# Patient Record
Sex: Female | Born: 1980 | Race: White | Hispanic: No | Marital: Married | Smoking: Never smoker
Health system: Southern US, Community
[De-identification: ages and names within clinical notes are randomized; demographics above are authoritative.]

## PROBLEM LIST (undated history)

## (undated) DIAGNOSIS — J159 Unspecified bacterial pneumonia: Secondary | ICD-10-CM

## (undated) DIAGNOSIS — R339 Retention of urine, unspecified: Secondary | ICD-10-CM

## (undated) HISTORY — PX: TONSILLECTOMY AND ADENOIDECTOMY: SUR1326

## (undated) HISTORY — DX: Unspecified bacterial pneumonia: J15.9

## (undated) HISTORY — DX: Retention of urine, unspecified: R33.9

## (undated) HISTORY — PX: WISDOM TOOTH EXTRACTION: SHX21

---
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
## 2009-12-08 ENCOUNTER — Inpatient Hospital Stay: Payer: Self-pay

## 2013-02-05 ENCOUNTER — Inpatient Hospital Stay: Payer: Self-pay | Admitting: Obstetrics and Gynecology

## 2013-02-05 LAB — CBC WITH DIFFERENTIAL/PLATELET
Basophil #: 0.1 10*3/uL (ref 0.0–0.1)
Basophil %: 0.4 %
HCT: 35 % (ref 35.0–47.0)
Lymphocyte #: 2.8 10*3/uL (ref 1.0–3.6)
Lymphocyte %: 19.4 %
MCV: 90 fL (ref 80–100)
Monocyte %: 5.6 %
Neutrophil %: 73.6 %
RDW: 13.6 % (ref 11.5–14.5)
WBC: 14.4 10*3/uL — ABNORMAL HIGH (ref 3.6–11.0)

## 2013-02-06 LAB — URINALYSIS, COMPLETE
Bacteria: NONE SEEN
Glucose,UR: 50 mg/dL (ref 0–75)
Nitrite: NEGATIVE
Protein: NEGATIVE
Specific Gravity: 1.008 (ref 1.003–1.030)
WBC UR: 8 /HPF (ref 0–5)

## 2014-09-05 NOTE — Consult Note (Signed)
Pt voiding.  327ml residual this am with 700ml volume prior.for discharge with Flomax for 10 daysoffice 10 days for bladder scan.  Electronic Signatures: Smith Robertope, Ejay Lashley S (MD)  (Signed on 26-Sep-14 08:16)  Authored  Last Updated: 26-Sep-14 08:16 by Smith Robertope, Zebastian Carico S (MD)

## 2014-09-05 NOTE — Consult Note (Signed)
PATIENT NAME:  Cheryl Cheryl [name], Cheryl Cheryl [name] MR#:  [id number] DATE OF BIRTH:  1981-02-02  DATE OF CONSULTATION:  02/07/2013  REFERRING PHYSICIAN:   CONSULTING PHYSICIAN:  Madolyn FriezeBrian S. Achilles Dunkope, MD  HISTORY: Cheryl Cheryl [name] is Cheryl [name] 34 year old white female who underwent recent induction and delivery. Cheryl [name] Foley catheter was placed after placing her epidural. She had severe pain and discomfort. It was removed after Cheryl [name] short period of time. Based on the description, it appears the balloon was likely inflated within the urethra. She has been unable to void since delivery. She has required catheterization with the largest bladder volume being approximately 1100 to 1200 mL. This suggests bladder overdistention which will affect subsequent bladder emptying. She has had no other significant difficulty with urination prior to this occurrence. She has had catheters in the past which did not cause similar discomfort as the initially placed catheter. She has had subsequent intermittent catheterization, also without significant difficulty or discomfort. Her current bladder volume is approximately 400 mL. She does have knowledge of urine in the bladder. She is having no significant pain or discomfort. Options for treatment were discussed at length with Cheryl Cheryl [name] and her husband.   PAST MEDICAL HISTORY: Otherwise noncontributory.   PAST SURGICAL HISTORY: Also noncontributory.   ALLERGIES: No known drug allergies.   MEDICATIONS ON ADMISSION: Prenatal vitamins, iron 1 tablet daily, acetaminophen 325 mg 1 to 2 tablets every 3 hours as needed for pain.   PHYSICAL EXAMINATION:  VITAL SIGNS: Afebrile, vital signs stable.  HEENT: Within normal limits.  CHEST: Clear to auscultation bilaterally.  CARDIOVASCULAR: Regular rate and rhythm.  ABDOMEN: Moderately protuberant with mild generalized tenderness consistent with postpartum state. No appreciable CVA tenderness.  EXTREMITIES: Free range of motion x 4.  NEUROLOGIC: Motor and sensory grossly intact.    ASSESSMENT: Urinary retention, possible urethral trauma, postpartum status.   RECOMMENDATIONS: The option of catheterization and the use of Flomax with voiding trial early next week was discussed. The option of trying Flomax now with intermittent catheterization for larger bladder volumes in an attempt for spontaneous return of voiding prior to discharge was also discussed. She has elected to proceed with the most conservative approach. Cheryl [name] prescription for Flomax was written. We will give an additional dose early in the morning. Every 1 to 2 hour bladder scans are recommended. The bladder volume can get up to approximately 800 mL. If she gets to this level without ability to urinate, intermittent catheterization is recommended. Continued monitoring with the bladder scan every few hours to assess bladder volume throughout the night is also recommended with intermittent catheterization as needed for larger bladder volumes. If there is no return of function by early morning, placing Cheryl [name] Foley catheter to gravity drainage with continuing the Flomax will be recommended. An outpatient voiding trial early next week would then be recommended. We will follow up tomorrow as to her status. If there are any further questions, please free to contact us.   ____________________________ Madolyn FriezeBrian S. Achilles Dunkope, MD bsc:gb D: 02/07/2013 19:38:34 ET T: 02/07/2013 21:46:19 ET JOB#: [id number]  cc: Madolyn FriezeBrian S. Achilles Dunkope, MD, <Dictator> Madolyn FriezeBRIAN S Charmain Diosdado MD ELECTRONICALLY SIGNED 02/08/2013 18:05

## 2014-09-05 NOTE — Consult Note (Signed)
Patient seen, chart reviewed, note dictated.  Assessment: Urinary retention, possible urethral trauma postpartum  Recommendation: It appears that her symptoms with the initial Foley catheter were likely related to the balloon being inflated within the urethra.  There is nothing to be done from this standpoint except time and healing.  This may be a factor in her difficulty with urination.  The large bladder volume will significantly reduce her chance for spontaneous voiding.  At her young age, there is still a chance that she may do reasonably well.  The option of a Foley catheter until early next week with the addition of Flomax was discussed.  The option of adding Flomax with an additional dose in the morning with intermittent catheterization for bladder volumes greater than 800 mL was also discussed.  She wishes to proceed along this course.  Orders have been entered for the Flomax tonight and in the morning with breakfast.  Catheterization orders have also been entered.  Bladder scan every hour or so depending on bladder volume is recommended to avoid recurrent overdistention.  If she is able to void with minimal residual by morning, it is okay for discharge without a Foley catheter.  She would likely need to remain on the Flomax for at least 1 additional week.  If she is unable to void by morning, replacing the Foley catheter and continuing the Flomax with follow-up early next week for a voiding trial will otherwise be recommended.  If there are any further questions, please feel free to contact us.  Electronic Signatures: Smith Robertope, Nydia Ytuarte S (MD)  (Signed on 25-Sep-14 17:34)  Authored  Last Updated: 25-Sep-14 17:34 by Smith Robertope, Leianna Barga S (MD)

## 2017-12-12 ENCOUNTER — Other Ambulatory Visit (HOSPITAL_COMMUNITY)
Admission: RE | Admit: 2017-12-12 | Discharge: 2017-12-12 | Disposition: A | Discharge status: Home / Self Care | Payer: 59 | Source: Ambulatory Visit | Attending: Certified Nurse Midwife | Admitting: Certified Nurse Midwife

## 2017-12-12 ENCOUNTER — Encounter: Payer: Self-pay | Admitting: Certified Nurse Midwife

## 2017-12-12 ENCOUNTER — Ambulatory Visit (INDEPENDENT_AMBULATORY_CARE_PROVIDER_SITE_OTHER): Payer: 59 | Admitting: Certified Nurse Midwife

## 2017-12-12 VITALS — BP 110/60 | HR 60 | Ht 67.0 in | Wt 171.0 lb

## 2017-12-12 DIAGNOSIS — Z30431 Encounter for routine checking of intrauterine contraceptive device: Secondary | ICD-10-CM | POA: Diagnosis not present

## 2017-12-12 DIAGNOSIS — Z01419 Encounter for gynecological examination (general) (routine) without abnormal findings: Secondary | ICD-10-CM | POA: Diagnosis not present

## 2017-12-12 DIAGNOSIS — N898 Other specified noninflammatory disorders of vagina: Secondary | ICD-10-CM | POA: Insufficient documentation

## 2017-12-12 DIAGNOSIS — Z01411 Encounter for gynecological examination (general) (routine) with abnormal findings: Secondary | ICD-10-CM | POA: Diagnosis not present

## 2017-12-12 DIAGNOSIS — Z975 Presence of (intrauterine) contraceptive device: Secondary | ICD-10-CM | POA: Diagnosis not present

## 2017-12-12 DIAGNOSIS — Z124 Encounter for screening for malignant neoplasm of cervix: Secondary | ICD-10-CM

## 2017-12-12 LAB — POCT WET PREP (WET MOUNT): TRICHOMONAS WET PREP HPF POC: ABSENT

## 2017-12-12 NOTE — Progress Notes (Signed)
Gynecology Annual Exam  PCP: Patient, No Pcp Per  Chief Complaint:  Chief Complaint  Patient presents with  . Gynecologic Exam    due to have mirena removed, ? reinserted; still gets bladder spasms infrequently    History of Present Illness:Cheryl [name] is a 37 year old Caucasian/White female, G2 P2002, who presents for her exam. She is no significant gyn problems. Occasionally will have bladder spasms if she waits too long to urinate (usually overnight) and has urinary hesitancy. Has not had to see Dr Achilles Dunk since 2016. Has had more vaginal discharge, sometimes with an odor.   She has had rare spotting on the IUD  The patient's past medical history is remarkable for a PPH after the birth of her first child and urinary retention after the birth of her second child due to urethral trauma from foley. Since her last annual exam 12/23/2014 , she has had no other significant changes in her health history.  She is sexually active. She is currently using an IUD for contraception. Mirena IUD was placed on 03/22/2013. She is thinking of having the Mirena replaced.   Her most recent pap smear was obtained 12/23/2014 and was with negative cells and negative HPV DNA.  Mammogram is not applicable.  There is no family history of breast cancer.  There is no family history of ovarian cancer.  The patient does not do monthly self breast exams.  The patient does not smoke.  The patient does not drink alcohol. The patient does not use illegal drugs.  The patient exercises by walking 2-3x/week x 30 min. Works in [employer].  The patient does get adequate calcium in her diet.  She had a recent cholesterol screen in 2018 at work  that she reports was  normal.     Review of Systems: Review of Systems  Constitutional: Negative for chills, fever and weight loss.  HENT: Negative for congestion, sinus pain and sore throat.   Eyes: Negative for blurred vision and pain.  Respiratory: Negative for hemoptysis,  shortness of breath and wheezing.   Cardiovascular: Negative for chest pain, palpitations and leg swelling.  Gastrointestinal: Negative for abdominal pain, blood in stool, diarrhea, heartburn, nausea and vomiting.  Genitourinary: Negative for dysuria, frequency, hematuria and urgency.       Amenorrhea on IUD; occasional bladder spasm  Musculoskeletal: Negative for back pain, joint pain and myalgias.  Skin: Negative for itching and rash.  Neurological: Negative for dizziness, tingling and headaches.  Endo/Heme/Allergies: Negative for environmental allergies and polydipsia. Does not bruise/bleed easily.       Negative for hirsutism   Psychiatric/Behavioral: Negative for depression. The patient is not nervous/anxious and does not have insomnia.     Past Medical History:  Past Medical History:  Diagnosis Date  . Bacterial pneumonia    age 2  . Postpartum hemorrhage    G1  . Urinary retention    after second delivery    Past Surgical History:  Past Surgical History:  Procedure Laterality Date  . MANDIBLE SURGERY  1999  . TONSILLECTOMY AND ADENOIDECTOMY     middle school age  . WISDOM TOOTH EXTRACTION     middle school/high school - all four    Family History:  Family History  Problem Relation Age of Onset  . Diabetes Mother        type 2 and GDM  . Hypertension Mother   . Deep vein thrombosis Mother   . Hypertension Father   .  Diabetes Maternal Grandmother   . Heart disease Maternal Grandmother     Social History:  Social History   Socioeconomic History  . Marital status: Married    Spouse name: Not on file  . Number of children: 2  . Years of education: 7619  . Highest education level: Not on file  Occupational History  . Occupation: PHYSICAL THERAPIST    Comment: [employer]  Social Needs  . Financial resource strain: Not on file  . Food insecurity:    Worry: Not on file    Inability: Not on file  . Transportation needs:    Medical: Not on file     Non-medical: Not on file  Tobacco Use  . Smoking status: Never Smoker  . Smokeless tobacco: Never Used  Substance and Sexual Activity  . Alcohol use: Yes    Comment: OCC  . Drug use: Never  . Sexual activity: Yes    Birth control/protection: IUD  Lifestyle  . Physical activity:    Days per week: Not on file    Minutes per session: Not on file  . Stress: Not on file  Relationships  . Social connections:    Talks on phone: Not on file    Gets together: Not on file    Attends religious service: Not on file    Active member of club or organization: Not on file    Attends meetings of clubs or organizations: Not on file    Relationship status: Not on file  . Intimate partner violence:    Fear of current or ex partner: Not on file    Emotionally abused: Not on file    Physically abused: Not on file    Forced sexual activity: Not on file  Other Topics Concern  . Not on file  Social History Narrative  . Not on file    Allergies:  No Known Allergies  Medications: Physical Exam Vitals: BP 110/60   Pulse 60   Ht 5\' 7"  (1.702 m)   Wt 171 lb (77.6 kg)   LMP  (LMP Unknown) Comment: has mirena  BMI 26.78 kg/m   General: pleasant WF in NAD HEENT: normocephalic, anicteric Neck: no thyroid enlargement, no palpable nodules, no cervical lymphadenopathy  Pulmonary: No increased work of breathing, CTAB Cardiovascular: RRR, without murmur  Breast: Breast symmetrical, no tenderness, no palpable nodules or masses, no skin or nipple retraction present, no nipple discharge.  No axillary, infraclavicular or supraclavicular lymphadenopathy. Abdomen: Soft, non-tender, non-distended.  Umbilicus without lesions.  No hepatomegaly or masses palpable. No evidence of hernia. Genitourinary:  External: Normal external female genitalia.  Normal urethral meatus, normal  Bartholin's and Skene's glands.    Vagina: Normal vaginal mucosa, no evidence of prolapse.    Cervix: Grossly normal in appearance,  no bleeding, non-tender, IUD string palpable, yellowish mucoid discharge at cervix  Uterus: Anteverted, normal size, shape, and consistency, mobile, and non-tender  Adnexa: No adnexal masses, non-tender  Rectal: deferred  Lymphatic: no evidence of inguinal lymphadenopathy Extremities: no edema, erythema, or tenderness Neurologic: Grossly intact Psychiatric: mood appropriate, affect full  Wet prep-negative for hyphae, clue cells or Trich   Assessment: 37 y.o. G2 P2 for well woman exam Mirena IUD in place-will expire later this year   Plan:   1) Breast cancer screening - recommend monthly self breast exam.   2) Cervical cancer screening - Pap was done. ASCCP guidelines and rational discussed.  Patient opts for every 3 years screening interval  3)  Contraception -options discussed. Desires to have Mirena IUD replaced. To return in 4-5 months for replacement  4) Routine healthcare maintenance including cholesterol and diabetes screening UTD   Farrel Conners, CNM

## 2017-12-13 LAB — CYTOLOGY - PAP
Diagnosis: NEGATIVE
HPV (WINDOPATH): NOT DETECTED

## 2017-12-15 ENCOUNTER — Encounter: Payer: Self-pay | Admitting: Certified Nurse Midwife

## 2018-02-26 DIAGNOSIS — Z713 Dietary counseling and surveillance: Secondary | ICD-10-CM | POA: Diagnosis not present

## 2018-04-24 ENCOUNTER — Encounter: Payer: Self-pay | Admitting: Nurse Practitioner

## 2018-04-24 ENCOUNTER — Ambulatory Visit (INDEPENDENT_AMBULATORY_CARE_PROVIDER_SITE_OTHER): Payer: 59 | Admitting: Nurse Practitioner

## 2018-04-24 ENCOUNTER — Other Ambulatory Visit: Payer: Self-pay

## 2018-04-24 VITALS — BP 106/68 | HR 77 | Temp 98.5°F | Ht 67.0 in | Wt 171.6 lb

## 2018-04-24 DIAGNOSIS — Z008 Encounter for other general examination: Secondary | ICD-10-CM

## 2018-04-24 DIAGNOSIS — Z7689 Persons encountering health services in other specified circumstances: Secondary | ICD-10-CM | POA: Diagnosis not present

## 2018-04-24 DIAGNOSIS — Z0189 Encounter for other specified special examinations: Secondary | ICD-10-CM | POA: Diagnosis not present

## 2018-04-24 NOTE — Progress Notes (Signed)
Subjective:    Patient ID: Cheryl [name], female    DOB: 12/17/1980, 38 y.o.   MRN: [id number]  Cheryl [name] is a 37 y.o. female presenting on 04/24/2018 for Establish Care (employee screening )   HPI Establish Care New Provider Pt last seen by PCP many years ago - pediatrics and has regular GYN care for prevention.  Obtain records from [facility].    Biometric Screening  Goal to lose 20 lbs to 148 lbs for getting back to pre-pregnancy weight.  Requires new biometric annually for insurance cost benefit.  Has made recent change to reduce carbohydrates in her diet to work toward weight loss.  - Participates in aerobics class ("pound" class) once monthly - in past was once weekly. - Active moderately with work but no other regular activity outside of work.  Past Medical History:  Diagnosis Date  . Bacterial pneumonia    age 17  . Postpartum hemorrhage    G1  . Urinary retention    after second delivery   Past Surgical History:  Procedure Laterality Date  . MANDIBLE SURGERY  1999  . TONSILLECTOMY AND ADENOIDECTOMY     middle school age  . WISDOM TOOTH EXTRACTION     middle school/high school - all four   Social History   Socioeconomic History  . Marital status: Married    Spouse name: Not on file  . Number of children: 2  . Years of education: 70  . Highest education level: Not on file  Occupational History  . Occupation: PHYSICAL THERAPIST    Comment: [employer]  Social Needs  . Financial resource strain: Not on file  . Food insecurity:    Worry: Not on file    Inability: Not on file  . Transportation needs:    Medical: Not on file    Non-medical: Not on file  Tobacco Use  . Smoking status: Never Smoker  . Smokeless tobacco: Never Used  Substance and Sexual Activity  . Alcohol use: Yes    Comment: OCC  . Drug use: Never  . Sexual activity: Yes    Birth control/protection: IUD  Lifestyle  . Physical activity:    Days per week: Not on file    Minutes  per session: Not on file  . Stress: Not on file  Relationships  . Social connections:    Talks on phone: Not on file    Gets together: Not on file    Attends religious service: Not on file    Active member of club or organization: Not on file    Attends meetings of clubs or organizations: Not on file    Relationship status: Not on file  . Intimate partner violence:    Fear of current or ex partner: Not on file    Emotionally abused: Not on file    Physically abused: Not on file    Forced sexual activity: Not on file  Other Topics Concern  . Not on file  Social History Narrative  . Not on file   Family History  Problem Relation Age of Onset  . Diabetes Mother        type 2 and GDM  . Hypertension Mother   . Deep vein thrombosis Mother   . Hypertension Father   . Diabetes Maternal Grandmother   . Heart disease Maternal Grandmother    Current Outpatient Medications on File Prior to Visit  Medication Sig  . ibuprofen (ADVIL,MOTRIN) 200 MG tablet Take 200  mg by mouth as needed.  Marland Kitchen levonorgestrel (MIRENA) 20 MCG/24HR IUD 1 each by Intrauterine route once.   No current facility-administered medications on file prior to visit.     Review of Systems  Constitutional: Negative for chills and fever.  HENT: Negative for congestion and sore throat.   Eyes: Negative for pain.  Respiratory: Negative for cough, shortness of breath and wheezing.   Cardiovascular: Negative for chest pain, palpitations and leg swelling.  Gastrointestinal: Negative for abdominal pain, blood in stool, constipation, diarrhea, nausea and vomiting.  Endocrine: Negative for polydipsia.  Genitourinary: Negative for dysuria, frequency, hematuria and urgency.  Musculoskeletal: Negative for back pain, myalgias and neck pain.  Skin: Negative.  Negative for rash.  Allergic/Immunologic: Negative for environmental allergies.  Neurological: Negative for dizziness, weakness and headaches.  Hematological: Does not  bruise/bleed easily.  Psychiatric/Behavioral: Negative for dysphoric mood and suicidal ideas. The patient is not nervous/anxious.    Per HPI unless specifically indicated above     Objective:    BP 106/68 (BP Location: Left Arm, Patient Position: Sitting, Cuff Size: Normal)   Pulse 77   Temp 98.5 F (36.9 C) (Oral)   Ht 5\' 7"  (1.702 m)   Wt 171 lb 9.6 oz (77.8 kg)   BMI 26.88 kg/m   Wt Readings from Last 3 Encounters:  04/24/18 171 lb 9.6 oz (77.8 kg)  12/12/17 171 lb (77.6 kg)    Physical Exam  Constitutional: She is oriented to person, place, and time. She appears well-developed and well-nourished. No distress.  HENT:  Head: Normocephalic and atraumatic.  Right Ear: External ear normal.  Left Ear: External ear normal.  Nose: Nose normal.  Mouth/Throat: Oropharynx is clear and moist.  Eyes: Pupils are equal, round, and reactive to light. Conjunctivae are normal.  Neck: Normal range of motion. Neck supple. No JVD present. No tracheal deviation present. No thyromegaly present.  Cardiovascular: Normal rate, regular rhythm, normal heart sounds and intact distal pulses. Exam reveals no gallop and no friction rub.  No murmur heard. Pulmonary/Chest: Effort normal and breath sounds normal. No respiratory distress.  Musculoskeletal: Normal range of motion.  Lymphadenopathy:    She has no cervical adenopathy.  Neurological: She is alert and oriented to person, place, and time. No cranial nerve deficit.  Skin: Skin is warm and dry. Capillary refill takes less than 2 seconds.  Multiple nevi, several on back that have irregular borders, one with more than one color  Psychiatric: She has a normal mood and affect. Her behavior is normal. Judgment and thought content normal.  Nursing note and vitals reviewed.   Results for orders placed or performed in visit on 12/12/17  POCT Wet Prep [facility])  Result Value Ref Range   Source Wet Prep POC vagina    WBC, Wet Prep HPF POC      Bacteria Wet Prep HPF POC  Few   BACTERIA WET PREP MORPHOLOGY POC     Clue Cells Wet Prep HPF POC None None   Clue Cells Wet Prep Whiff POC     Yeast Wet Prep HPF POC None None   KOH Wet Prep POC  None   Trichomonas Wet Prep HPF POC Absent Absent  Cytology - PAP  Result Value Ref Range   Adequacy      Satisfactory for evaluation  endocervical/transformation zone component PRESENT.   Diagnosis      NEGATIVE FOR INTRAEPITHELIAL LESIONS OR MALIGNANCY.   HPV NOT DETECTED    Material Submitted  CervicoVaginal Pap [ThinPrep Imaged]    CYTOLOGY - PAP PAP RESULT       Assessment & Plan:   Problem List Items Addressed This Visit    None    Visit Diagnoses    Encounter for biometric screening    -  Primary Patient due for biometric screen. - Advised to have about 15 lbs weight loss for normal BMI - Increase physical activity - Labs fasting in next 7 days. - FOLLOW-UP for annual physical October 2020.   Relevant Orders   Lipid panel   BASIC METABOLIC PANEL WITH GFR   Encounter to establish care     [facility]  [facility] be [facility].  Past medical, family, and surgical history reviewed w/ patient in clinic.       Follow up plan: Return in about 10 months (around 02/23/2019) for annual physical.  Wilhelmina McardleLauren Bomani Oommen, DNP, AGPCNP-BC Adult Gerontology Primary Care Nurse Practitioner [facility] 04/24/2018, 2:27 PM

## 2018-04-24 NOTE — Patient Instructions (Addendum)
Ronn MelenaEmily A Armistead,   Thank you for coming in to clinic today.  1. You will be due for FASTING BLOOD WORK.  This means you should eat no food or drink after midnight.  Drink only water or coffee without cream/sugar on the morning of your lab visit. - Please go ahead and schedule a "Lab Only" visit in the morning at the clinic for lab draw in the next 7 days. - Your results will be available about 2-3 days after blood draw.  If you have set up a MyChart account, you can can log in to MyChart online to view your results and a brief explanation. Also, we can discuss your results together at your next office visit if you would like.   Please schedule a follow-up appointment with Wilhelmina McardleLauren Harlen Danford, AGNP.  Return in about 10 months (around 02/23/2019) for annual physical.  If you have any other questions or concerns, please feel free to call the clinic or send a message through MyChart. You may also schedule an earlier appointment if necessary.  You will receive a survey after today's visit either digitally by e-mail or paper by [employer] mail. Your experiences and feedback matter to us.  Please respond so we know how we are doing as we provide care for you.   Wilhelmina McardleLauren Totiana Everson, DNP, AGNP-BC Adult Gerontology Nurse Practitioner [facility], [facility]

## 2018-04-26 DIAGNOSIS — Z0189 Encounter for other specified special examinations: Secondary | ICD-10-CM | POA: Diagnosis not present

## 2018-04-27 LAB — BASIC METABOLIC PANEL WITH GFR
BUN: 17 mg/dL (ref 7–25)
CO2: 25 mmol/L (ref 20–32)
Calcium: 9.2 mg/dL (ref 8.6–10.2)
Chloride: 104 mmol/L (ref 98–110)
Creat: 0.93 mg/dL (ref 0.50–1.10)
GFR, Est African American: 92 mL/min/{1.73_m2} (ref >=60)
GFR, Est Non African American: 79 mL/min/{1.73_m2} (ref >=60)
Glucose, Bld: 94 mg/dL (ref 65–99)
Potassium: 4.2 mmol/L (ref 3.5–5.3)
Sodium: 139 mmol/L (ref 135–146)

## 2018-04-27 LAB — LIPID PANEL
Cholesterol: 185 mg/dL (ref <200)
HDL: 64 mg/dL (ref >50)
LDL Cholesterol (Calc): 107 mg/dL (calc) — ABNORMAL HIGH
Non-HDL Cholesterol (Calc): 121 mg/dL (calc) (ref <130)
Total CHOL/HDL Ratio: 2.9 (calc) (ref <5.0)
Triglycerides: 57 mg/dL (ref <150)

## 2018-05-28 DIAGNOSIS — E669 Obesity, unspecified: Secondary | ICD-10-CM | POA: Diagnosis not present

## 2018-05-28 DIAGNOSIS — Z713 Dietary counseling and surveillance: Secondary | ICD-10-CM | POA: Diagnosis not present

## 2018-06-07 ENCOUNTER — Telehealth: Payer: Self-pay | Admitting: Certified Nurse Midwife

## 2018-06-07 NOTE — Telephone Encounter (Signed)
Patient is schedule 06/15/18 with CLG for Mirena removal and reinsertion at 8:50

## 2018-06-15 ENCOUNTER — Encounter: Payer: Self-pay | Admitting: Certified Nurse Midwife

## 2018-06-15 ENCOUNTER — Ambulatory Visit (INDEPENDENT_AMBULATORY_CARE_PROVIDER_SITE_OTHER): Payer: 59 | Admitting: Certified Nurse Midwife

## 2018-06-15 VITALS — BP 118/78 | Ht 67.0 in | Wt 170.0 lb

## 2018-06-15 DIAGNOSIS — Z30433 Encounter for removal and reinsertion of intrauterine contraceptive device: Secondary | ICD-10-CM

## 2018-06-15 NOTE — Progress Notes (Signed)
    GYNECOLOGY OFFICE PROCEDURE NOTE  Cheryl [name] is a 38 y.o. [phone] here for Mirena IUD replacement. Her current IUD was inserted 03/22/2013 and is expiring. Has had no concerns with her current IUD and would like to have a new Mirena inserted.  Last pap smear was on 12/12/2017 and was normal.   Patient identified, informed consent performed, consent signed.   Discussed risks of irregular bleeding, cramping, infection, expulsion, and malpositioning of the IUD. Time out was performed.   On bimanual exam, uterus was Anteverted. Speculum placed in the vagina. The IUD strings were not readily seen at the cervix, but were just inside the cervix. The strings were grasped blindly with a packing forceps and  The IUD was removed easily and intact. The cervix was then prepped with Betadine x 2. Cervix was sprayed with Hurricaine anesthetic and  grasped anteriorly with a single tooth tenaculum.  Uterus sounded to 6 cm.  Mirena  IUD placed per manufacturer's recommendations.  Strings trimmed to 3 cm. Tenaculum was removed, and silver nitrate was applied to tenaculum sites for hemostasis.  Patient tolerated procedure well.   Patient was given post-procedure instructions.  Patient was also asked to check IUD strings periodically and follow up in 4 weeks for IUD check.  Farrel Conners, CNM 06/15/18

## 2018-07-13 ENCOUNTER — Encounter: Payer: Self-pay | Admitting: Certified Nurse Midwife

## 2018-07-13 ENCOUNTER — Ambulatory Visit (INDEPENDENT_AMBULATORY_CARE_PROVIDER_SITE_OTHER): Payer: 59 | Admitting: Certified Nurse Midwife

## 2018-07-13 VITALS — BP 100/52 | HR 67 | Ht 67.0 in | Wt 170.0 lb

## 2018-07-13 DIAGNOSIS — N343 Urethral syndrome, unspecified: Secondary | ICD-10-CM | POA: Diagnosis not present

## 2018-07-13 DIAGNOSIS — Z30431 Encounter for routine checking of intrauterine contraceptive device: Secondary | ICD-10-CM

## 2018-07-13 DIAGNOSIS — M6289 Other specified disorders of muscle: Secondary | ICD-10-CM

## 2018-07-13 NOTE — Progress Notes (Signed)
  History of Present Illness:  Cheryl [name] is a 38 y.o. that had a Mirena IUD placed approximately 1 month ago. Since that time, she states that she has had no bleeding or pain She does complain of "butt spasms" . These occur randomly and used to come once in 2-3 months, but recently they have become more frequent. The "spasm" or pain usually lasts 10-15 minutes and she can no sit or move while that is going on. The pain does not occur when she is having a BM. No blood in her stool. She also reports occasionally having the urge to urinate, but not being able to empty her bladder until her muscles relax. Has a history of 2 vaginal deliveries and has had some left lower back pains since then. . No other trauma to her pelvic floor. No dyspareunia. Has started at a boot camp in January and is lifting more weights/ exercising more in the last 2 months. PMHx: She  has a past medical history of Bacterial pneumonia, Postpartum hemorrhage, and Urinary retention. Also,  has a past surgical history that includes Mandible surgery (1999); Tonsillectomy and adenoidectomy; and Wisdom tooth extraction., family history includes Alzheimer's disease in her paternal grandfather; Deep vein thrombosis in her mother; Diabetes in her maternal grandmother and mother; Healthy in her daughter and daughter; Heart disease in her maternal grandmother; Hypertension in her father and mother; Mental illness in her brother.,  reports that she has never smoked. She has never used smokeless tobacco. She reports current alcohol use. She reports that she does not use drugs.  She has a current medication list which includes the following prescription(s): ibuprofen and levonorgestrel. Also, has No Known Allergies.  ROS  Physical Exam:  Vital signs: BP (!) 100/52   Pulse 67   Ht 5\' 7"  (1.702 m)   Wt 170 lb (77.1 kg)   BMI 26.63 kg/m   Constitutional: Well nourished, well developed female in no acute distress.  Neuro: Grossly  intact Psych:  Normal mood and affect.    Pelvic exam: External/BUS: no lesion, no discharge Vagina: no lesions, no bleeding Cervix: Two IUD strings present seen coming from the cervical os.   Assessment: IUD strings present in proper location; pt doing well Possible pelvic floor muscle spasms  Plan: Aware that her IUD expires in 5 years. Consult PT for pelvic floor therapy/ evaluation  She was amenable to this plan and we will see her back in 1 year/PRN.  Farrel Conners, CNM  Farrel Conners, [place]

## 2018-10-15 ENCOUNTER — Ambulatory Visit: Payer: 59 | Attending: Certified Nurse Midwife

## 2018-10-15 ENCOUNTER — Other Ambulatory Visit: Payer: Self-pay

## 2018-10-15 DIAGNOSIS — M62838 Other muscle spasm: Secondary | ICD-10-CM | POA: Diagnosis present

## 2018-10-15 DIAGNOSIS — R293 Abnormal posture: Secondary | ICD-10-CM

## 2018-10-15 NOTE — Patient Instructions (Signed)
    Sit up with a tall spine and cross one leg over the other knee. Hinge from the hip and lean until you can feel a stretch through your bottom hold for 5 deep breaths and then switch sides. Repeat 2-3 times on each side.   Self-assess the pelvic floor muscles by inserting one finger into the vagina and trying to "squeeze" like you   Self External Trigger Point Relief    1) Wash your hands and prop yourself up in a way where you can easily reach the vagina. You may wish to have a small hand-held mirror near by.  2) Use the 2 middle fingers to put gentle pressure on the three external pelvic floor muscles and hold pressure and take deep breaths as you allow the tension to release and discomfort to dissipate   3) Repeat the process for any trigger points you find spending between 3-10 minutes on this every 1-2 days until you do not find any more trigger points or you are told otherwise by your therapist.  Self Internal Trigger Point Relief    1) Wash your hands and prop yourself up in a way where you can easily reach the vagina. You may wish to have a small hand-held mirror near by.  2) lubricate the tool and vaginal opening using a hypoallergenic lubricant such as "slippery-stuff".   3) Slowly and gently insert the tool into the vagina using deep breaths to allow relaxation of the muscles around the tool.  4) Avoiding the "12 o-clock" region near the urethra, gently use the handle of the tool like a lever to press the angled tip of the tool onto the wall of the pelvic floor.   5) Move the tool to different areas of the pelvic floor and feel for areas that are tender called "trigger points". When you find one hold the tool still, applying just enough pressure to elicit mild discomfort and take deep belly breaths until the discomfort subsides or decreases by at least 50%.   6) Repeat the process for any trigger points you find spending between 3-10 minutes on this per night until you do not  find any more trigger points or you are told otherwise by your therapist..   Female version: Dr. Kathline Magic Premium Prostate Massager      ([place]) Hold for 30 seconds (5 deep breaths) and repeat 2-3 times on each side once a day  Pelvic Rotation: Contract / Relax (Supine)   MET to Correct Right Anteriorly Rotated/Left Posteriorly Rotated Innominate   Begin laying on your back with your feet at 90 degrees. Put a dowel/broomstick  through your legs, behind your right knee and in front of your left knee. Stabilize the dowel on ether side with your hands.  Press down with the right leg and up with the left leg. Hold for 5 seconds  then slowly relax. Repeat 5 times.

## 2018-10-15 NOTE — Therapy (Signed)
[facility] MAIN [facility] SERVICES [address] [place], [place], [address] Phone: [phone]   Fax:  [phone]  Physical Therapy Evaluation  The patient has been informed of current processes in place at Outpatient Rehab to protect patients from Covid-19 exposure including social distancing, schedule modifications, and new cleaning procedures. After discussing their particular risk with a therapist based on the patient's personal risk factors, the patient has decided to proceed with in-person therapy.   Patient Details  Name: Cheryl [name] MRN: [id number] Date of Birth: Aug 03, 1980 Referring Provider (PT): Farrel Conners   Encounter Date: 10/15/2018  PT End of Session - 10/15/18 0923    Visit Number  1    Number of Visits  10    Date for PT Re-Evaluation  12/24/18    Authorization - Visit Number  1    Authorization - Number of Visits  10    PT Start Time  0740    PT Stop Time  0840    PT Time Calculation (min)  60 min    Activity Tolerance  Patient tolerated treatment well;No increased pain       Past Medical History:  Diagnosis Date  . Bacterial pneumonia    age 38  . Postpartum hemorrhage    G1  . Urinary retention    after second delivery    Past Surgical History:  Procedure Laterality Date  . MANDIBLE SURGERY  1999  . TONSILLECTOMY AND ADENOIDECTOMY     middle school age  . WISDOM TOOTH EXTRACTION     middle school/high school - all four    There were no vitals filed for this visit.     Pelvic Floor Physical Therapy Evaluation and Assessment  SCREENING  Falls in last 6 mo: no    Patient's communication preference:   Red Flags:  Have you had any night sweats? no Unexplained weight loss? no Saddle anesthesia? no Unexplained changes in bowel or bladder habits? no  SUBJECTIVE  Patient reports: Prior to having kids, sex was painful. Has not had this problem since. Has "butt spasms" cannot remember if this  started before or after children happens once every ~1 month but had 2 in one week when she went to OBG   Recently. Does not know what brings it on but had started a boot camp class the week that it had become more frequently. It is extremely painful and lasts for ~ 2 minutes. When she had her 37 year old she had an epidural and had a catheter she feels that her catheter was misplaced. She had ~ 2L in her bladder when in-and-out catheterized after labor. She still has issues beginning the flow of urine when bladder is full first thing in the morning. Sleeps on back or side. Feels that she got "stretched out" when she had her kids and now it is not as bad. Left hip "struggles sometimes" had to have her husband press on it. Thinks she may have mild stenosis based on the anesthesiologists report.   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  Recent Procedures/Tests/Findings:  none  Obstetrical History: 2 deliveries, had grade 3 tearing with first and bladder damage with the second.  Gynecological History: none  Urinary History: Has hesitancy ~ 1x/month. Is unsure if she has mild SUI or discharge.  Gastrointestinal History: Has a BM ~ every day. Grade 1-4 varies based on diet. ~ 32 oz of H2o per day. Drinking ~ 1-2 caffinated sodas per day.  Sexual activity/pain: Had pain before with intercourse but it is now "not nearly as bad".  Location of pain: low back/hip pain Current pain:  0/10  Max pain:  3/10 Least pain:  0/10 Nature of pain: deep, achy, L radicular  Patient Goals:  Learn what she can do to stop the spasms and to be able to empty her bladder when it has.     OBJECTIVE  Posture/Observations:  Sitting:  Standing: R shoulder low, r handed, L ASIS high, L low,   Supine: L ASIS  Prone L low,   Palpation/Segmental Motion/Joint Play: L>R for TTP at QL, Piriformis, Glute min, and OI.   Special tests:   Forward bend: HS stop her, touching the floor with  fingertips, mild L lumbar curve.   Supine to long sit: L long in lying, even in seated. Stork: Pt. Reports decreased stability on L.     Range of Motion/Flexibilty: Deferred to follow-up Spine:  Hips:   Strength/MMT:  Deferred to follow-up  LE MMT  LE MMT Left Right  Hip flex:  (L2) /5 /5  Hip ext: /5 /5  Hip abd: /5 /5  Hip add: /5 /5  Hip IR /5 /5  Hip ER /5 /5     Abdominal:  Palpation: no ttp Diastasis: none visualized, not palpated.  Pelvic Floor External Exam: Deferred to next visit/ self assessment as possible.  Introitus Appears:  Skin integrity:  Palpation: Cough: Prolapse visible?: Scar mobility:  Internal Vaginal Exam: Strength (PERF):  Symmetry: Palpation: Prolapse:   Internal Rectal Exam: Strength (PERF): Symmetry: Palpation: Prolapse:    Pelvic Floor Outcome Measures: Female NIH-CPSI: 10/43 (23%), PFDI: 29/300   INTERVENTIONS THIS SESSION: Self-care: Educated on the structure and function of the pelvic floor in relation to their symptoms as well as the POC, and initial HEP in order to set patient expectations and understanding from which we will build on in the future sessions. Educated on how to perform self-assessment and TP release as needed, educated on how her L hip is likely leading to her PFM dysfunction.  Total time: 60 min.     [facility] PT Assessment - 10/15/18 0001      Assessment   Medical Diagnosis  Pelvic floor tension    Referring Provider (PT)  Farrel ConnersGutierrez, Colleen    Onset Date/Surgical Date  10/14/12    Hand Dominance  Right    Prior Therapy  none      Precautions   Precautions  None      Restrictions   Weight Bearing Restrictions  No      Balance Screen   Has the patient fallen in the past 6 months  No      Home Environment   Living Environment  Private residence    Living Arrangements  Spouse/significant other;Children    Available Help at Discharge  Family    Type of Home  House    Home Access  Stairs to  enter    Entrance Stairs-Number of Steps  1    Entrance Stairs-Rails  None    Home Layout  Two level    Alternate Level Stairs-Number of Steps  --   12   Alternate Level Stairs-Rails  Right      Prior Function   Level of Independence  Independent    Vocation  Full time employment    Vocation Requirements  Physical Therapist (HH)                Objective  measurements completed on examination: See above findings.                PT Short Term Goals - 10/15/18 0934      PT SHORT TERM GOAL #1   Title  Patient will demonstrate a coordinated contraction, relaxation, and bulge of the pelvic floor muscles to demonstrate functional recruitment and motion and allow for further strengthening.    Baseline  Pt. having difficulty relaxing PFM to empty bladder.    Time  5    Period  Weeks    Status  New    Target Date  11/19/18      PT SHORT TERM GOAL #2   Title  Patient will demonstrate improved pelvic alignment and balance of musculature surrounding the pelvis to facilitate decreased PFM spasms and decrease pelvic pain.    Baseline  L posterior rotation/upslip and spasms surrounding    Time  5    Period  Weeks    Status  New    Target Date  11/19/18      PT SHORT TERM GOAL #3   Title  Patient will demonstrate ability to perform self internal TP release in order to facilitate further PFM spasm reduction at home for faster resolution of symptoms    Baseline  Pt. lacks knowledge of how to perform  self TP release to decrease spasms and pain.    Time  5    Period  Weeks    Status  New    Target Date  11/19/18        PT Long Term Goals - 10/15/18 0937      PT LONG TERM GOAL #1   Title  Patient will report no "butt spasms" over past two weeks with increased activity level to demonstrate improved functional ability.    Baseline  had 2 "butt spasms" within 1 week while performing boot camp exercise.    Time  10    Period  Weeks    Status  New    Target Date   12/24/18      PT LONG TERM GOAL #2   Title  "Patient will score at or below 14/300 on the PFDI and 10% on the Female NIH-CPSI to demonstrate a clinically meaningful decrease in disability and distress due to pelvic floor dysfunction.    Baseline  Female NIH-CPSI: 10/43 (23%),  PFDI: 29/300     Time  10    Period  Weeks    Status  New    Target Date  12/24/18      PT LONG TERM GOAL #3   Title  Pt. will report no incidents of difficulty initiating stream of urine over the past montho demonstrate improved PFM coordination and relaxation and increased QOL.    Baseline  ~2x/month Pt.  will have difficulty emptying bladder in the morning      Time  10    Period  Weeks    Status  New    Target Date  12/24/18             Plan - 10/15/18 4098    Clinical Impression Statement  Pt. is a 38 y/o female who presents today with cheif c/o pelvic pain/rectal spasm and occasional urinary hesitancy. Her PMH is significant for 2 vaginal deliveries, 1 with grade 3 tear and the other with bladder trauma. Her clinical exam reveals a L innominate upslip/posterior rotation and spasms through L>R posterior hip, low back, and B adductors. Her HPI  indicates likely internal spasms as a result of pelvic mal-alignment and true vs. apparent leg-length discrepancy. Pt. will benefit from skilled pelvic PT to address noted defecits and to assess for and address other potential causes of pelvic pain and spasm.      Personal Factors and Comorbidities  Finances    Examination-Activity Limitations  Toileting    Examination-Participation Restrictions  Interpersonal Relationship    Stability/Clinical Decision Making  Stable/Uncomplicated    Clinical Decision Making  Low    Rehab Potential  Good    PT Frequency  1x / week    PT Duration  Other (comment)   10 weeks   PT Treatment/Interventions  ADLs/Self Care Home Management;Biofeedback;Electrical Stimulation;Moist Heat;Traction;Functional mobility training;Therapeutic  activities;Therapeutic exercise;Neuromuscular re-education;Manual techniques;Patient/family education;Scar mobilization;Dry needling;Taping;Spinal Manipulations;Joint Manipulations    PT Next Visit Plan  TP release surrounding L hip, correction for up-slip/rotation and internal exam if possible.    PT Home Exercise Plan  Piriformis stretch, L side stretch, self internal and external TP release, tool, self assessment.    Consulted and Agree with Plan of Care  Patient       Patient will benefit from skilled therapeutic intervention in order to improve the following deficits and impairments:  Pain, Postural dysfunction, Increased muscle spasms  Visit Diagnosis: Other muscle spasm  Abnormal posture     Problem List Patient Active Problem List   Diagnosis Date Noted  . IUD (intrauterine device) in place 12/12/2017   Cleophus Molt DPT, ATC Cleophus Molt 10/15/2018, 9:59 AM  [facility] MAIN [facility] SERVICES [address] [place], [place], [address] Phone: [phone]   Fax:  [phone]  Name: Cheryl [name] MRN: [id number] Date of Birth: 1980/12/27

## 2018-10-22 ENCOUNTER — Ambulatory Visit: Payer: 59

## 2018-10-29 ENCOUNTER — Ambulatory Visit: Payer: 59

## 2018-11-05 ENCOUNTER — Other Ambulatory Visit: Payer: Self-pay

## 2018-11-05 ENCOUNTER — Ambulatory Visit: Payer: 59

## 2018-11-05 DIAGNOSIS — M62838 Other muscle spasm: Secondary | ICD-10-CM

## 2018-11-05 DIAGNOSIS — R293 Abnormal posture: Secondary | ICD-10-CM

## 2018-11-05 NOTE — Therapy (Signed)
[facility] [address] [place], [place], [address] Phone: [phone]   Fax:  [phone]  Physical Therapy Treatment  The patient has been informed of current processes in place at Outpatient Rehab to protect patients from Covid-19 exposure including social distancing, schedule modifications, and new cleaning procedures. After discussing their particular risk with a therapist based on the patient's personal risk factors, the patient has decided to proceed with in-person therapy.   Patient Details  Name: Cheryl [name] MRN: [id number] Date of Birth: 26-Jun-1980 Referring Provider (PT): Farrel ConnersGutierrez, Colleen   Encounter Date: 11/05/2018    Past Medical History:  Diagnosis Date  . Bacterial pneumonia    age 38  . Postpartum hemorrhage    G1  . Urinary retention    after second delivery    Past Surgical History:  Procedure Laterality Date  . MANDIBLE SURGERY  1999  . TONSILLECTOMY AND ADENOIDECTOMY     middle school age  . WISDOM TOOTH EXTRACTION     middle school/high school - all four    There were no vitals filed for this visit.   Pelvic Floor Physical Therapy Treatment Note  SCREENING  Changes in medications, allergies, or medical history?: no    SUBJECTIVE  Patient reports: Has been doing stretches, looked at massager but has not purchased yet.  Precautions:  none  Pain update:  Location of pain: LLB Current pain:  1/10  Max pain:  3/10 Least pain:  0/10 Nature of pain: deep, achy, L radicular  Patient Goals: Learn what she can do to stop the spasms and to be able to empty her bladder when it has.     OBJECTIVE  Changes in: Posture/Observations:  L anterior rotation/up-slip pre-treatment. Level at ankles and ASIS in supine, PSIS and ASIS following treatment.   Leg-length: 88cm B  Range of Motion/Flexibilty:  Decreased mobility around sacrum  Pelvic floor:  External Exam: Introitus  Appears: normal Skin integrity: normal Palpation: TTP to R STP, IC, and Coccygeus, none on L  Cough: Paradoxical Prolapse visible?: minimal with bearing down of anterior/posterior walls. Scar mobility: decreased mobility at posterior fourchette.  Internal Vaginal Exam: Strength (PERF): 2/5, 2 seconds Symmetry: greater TTP through all on L Palpation: TTP to all PFM with exception of L anterior PR. Prolapse: minimal   Palpation: TTP to L Iliacus, Psoas, and QL. Iliacus not responding well to TP release.   INTERVENTIONS THIS SESSION: Self-care: educated on "thumb-work" to help increase scar mobility and decrease pressure on perineal nerve bundle for overall PFM relaxation. Manual: assessed PFM internally to help direct Pt. In self-treatment and Performed TP release to L Iliacus, QL, and Psoas followed by L up-slip correction to improve pelvic alignment and decrease pressure on nerve roots leading to the PFM to allow for maintained spasm reduction and decreased pain following self TP release internally.  Total time: 60 min.                             PT Short Term Goals - 10/15/18 0934      PT SHORT TERM GOAL #1   Title  Patient will demonstrate a coordinated contraction, relaxation, and bulge of the pelvic floor muscles to demonstrate functional recruitment and motion and allow for further strengthening.    Baseline  Pt. having difficulty relaxing PFM to empty bladder.    Time  5    Period  Weeks  Status  New    Target Date  11/19/18      PT SHORT TERM GOAL #2   Title  Patient will demonstrate improved pelvic alignment and balance of musculature surrounding the pelvis to facilitate decreased PFM spasms and decrease pelvic pain.    Baseline  L posterior rotation/upslip and spasms surrounding    Time  5    Period  Weeks    Status  New    Target Date  11/19/18      PT SHORT TERM GOAL #3   Title  Patient will demonstrate ability to perform self  internal TP release in order to facilitate further PFM spasm reduction at home for faster resolution of symptoms    Baseline  Pt. lacks knowledge of how to perform  self TP release to decrease spasms and pain.    Time  5    Period  Weeks    Status  New    Target Date  11/19/18        PT Long Term Goals - 10/15/18 0937      PT LONG TERM GOAL #1   Title  Patient will report no "butt spasms" over past two weeks with increased activity level to demonstrate improved functional ability.    Baseline  had 2 "butt spasms" within 1 week while performing boot camp exercise.    Time  10    Period  Weeks    Status  New    Target Date  12/24/18      PT LONG TERM GOAL #2   Title  "Patient will score at or below 14/300 on the PFDI and 10% on the Female NIH-CPSI to demonstrate a clinically meaningful decrease in disability and distress due to pelvic floor dysfunction.    Baseline  Female NIH-CPSI: 10/43 (23%),  PFDI: 29/300     Time  10    Period  Weeks    Status  New    Target Date  12/24/18      PT LONG TERM GOAL #3   Title  Pt. will report no incidents of difficulty initiating stream of urine over the past montho demonstrate improved PFM coordination and relaxation and increased QOL.    Baseline  ~2x/month Pt.  will have difficulty emptying bladder in the morning      Time  10    Period  Weeks    Status  New    Target Date  12/24/18            Plan - 11/05/18 1132    Clinical Impression Statement  Pt. respondfed well to all interventions today, demonstrating improved pelvic alignment and decreased pain and spasm as well as understanding of all education provided. Continue per POC.    Personal Factors and Comorbidities  Finances    Examination-Activity Limitations  Toileting    Examination-Participation Restrictions  Interpersonal Relationship    Stability/Clinical Decision Making  Stable/Uncomplicated    Rehab Potential  Good    PT Frequency  1x / week    PT Duration  Other  (comment)   10 weeks   PT Treatment/Interventions  ADLs/Self Care Home Management;Biofeedback;Electrical Stimulation;Moist Heat;Traction;Functional mobility training;Therapeutic activities;Therapeutic exercise;Neuromuscular re-education;Manual techniques;Patient/family education;Scar mobilization;Dry needling;Taping;Spinal Manipulations;Joint Manipulations    PT Next Visit Plan  Review External TP release on R side, perform internal TP release-re-check PRN. educate on how to strengthen PFM following release and review pre-squeeze and sneeze. Re-assess pelvic alignment, hive HEP to address defecits PRN.    PT Home  Exercise Plan  Piriformis stretch, L side stretch, self internal and external TP release, tool, self assessment, thumb-work.    Consulted and Agree with Plan of Care  Patient       Patient will benefit from skilled therapeutic intervention in order to improve the following deficits and impairments:  Pain, Postural dysfunction, Increased muscle spasms  Visit Diagnosis: 1. Other muscle spasm   2. Abnormal posture        Problem List Patient Active Problem List   Diagnosis Date Noted  . IUD (intrauterine device) in place 12/12/2017   Cleophus MoltKeeli T. Homero Hyson DPT, ATC Cleophus MoltKeeli T Ceclia Koker 11/05/2018, 11:36 AM  [facility] [address] [place], [place], [address] Phone: [phone]   Fax:  [phone]  Name: Cheryl [name] MRN: [id number] Date of Birth: 23-Feb-1981

## 2018-11-05 NOTE — Patient Instructions (Signed)
Self Posterior Fourchette Stretching/Mobilization "thumb work"    1) Landscape architect and prop your body up so you can easily reach the vagina, bring hand-held mirror if desired.  2) Apply lubricant to the thumb and vaginal opening  3) Place thumb ~ 1/2 an inch into the vagina with the pad of the thumb pointed down and apply gentle pressure to the posterior fourchette.  4) Gently sweep the thumb side to side and in/out while maintaining pressure down toward the anus. Make sure the pressure is not so great that your muscles tighten up and guard, just enough to create slight discomfort.  Do this for ~ 3 min. Per night to decrease tightness and tenderness at the vaginal opening.

## 2018-11-12 ENCOUNTER — Ambulatory Visit: Payer: 59

## 2018-11-19 ENCOUNTER — Ambulatory Visit: Payer: 59

## 2018-11-26 ENCOUNTER — Ambulatory Visit: Payer: 59

## 2018-12-03 ENCOUNTER — Other Ambulatory Visit: Payer: Self-pay

## 2018-12-03 ENCOUNTER — Telehealth: Payer: Self-pay

## 2018-12-03 ENCOUNTER — Ambulatory Visit: Payer: 59 | Attending: Certified Nurse Midwife

## 2018-12-03 DIAGNOSIS — R293 Abnormal posture: Secondary | ICD-10-CM | POA: Insufficient documentation

## 2018-12-03 DIAGNOSIS — M62838 Other muscle spasm: Secondary | ICD-10-CM

## 2018-12-03 NOTE — Telephone Encounter (Signed)
Discussed with Kelita.  Recommend patient have testing performed due to direct exposure.  Patient advised to have virtual appt here to discuss further if needed.

## 2018-12-03 NOTE — Telephone Encounter (Signed)
The pt called with some concerns of recent exposure on Friday by a unmasked patient with a trach who was running a fever of 100.6 that tested positive for COVID-19. She had on a surgical mask while given care. She is asymptomatic, but very concern about exposing other patients or even her children. She wanted to know if she decided to be tested now would it be to early and increase her chance with getting a false negative.

## 2018-12-03 NOTE — Telephone Encounter (Signed)
I offered the patient an appt to discuss COVID-19 exposure concerns. The patient declined the appt at this time, but states she will call back if she decides to proceed with an appt.

## 2018-12-03 NOTE — Telephone Encounter (Signed)
Patient exposed to person with COVID at work and would like to ask questions.

## 2018-12-03 NOTE — Therapy (Signed)
[name] [address] [place], [place], [address] Phone: [phone]   Fax:  [phone]  Physical Therapy Treatment and Discharge Summary  The patient has been informed of current processes in place at Outpatient Rehab to protect patients from Covid-19 exposure including social distancing, schedule modifications, and new cleaning procedures. After discussing their particular risk with a therapist based on the patient's personal risk factors, the patient has decided to proceed with in-person therapy.   Patient Details  Name: Cheryl [name] MRN: [id number] Date of Birth: [birth year] Referring Provider (PT): Dalia Heading   Encounter Date: 12/03/2018  PT End of Session - 12/04/18 0836    Visit Number  3    Number of Visits  10    Date for PT Re-Evaluation  12/24/18    Authorization - Visit Number  3    Authorization - Number of Visits  10    PT Start Time  0263    PT Stop Time  7858    PT Time Calculation (min)  60 min    Activity Tolerance  Patient tolerated treatment well;No increased pain       Past Medical History:  Diagnosis Date  . Bacterial pneumonia    age 12  . Postpartum hemorrhage    G1  . Urinary retention    after second delivery    Past Surgical History:  Procedure Laterality Date  . MANDIBLE SURGERY  1999  . TONSILLECTOMY AND ADENOIDECTOMY     middle school age  . WISDOM TOOTH EXTRACTION     middle school/high school - all four    There were no vitals filed for this visit.    Pelvic Floor Physical Therapy Treatment Note  SCREENING  Changes in medications, allergies, or medical history?: none    SUBJECTIVE  Patient reports: Has been using the self TP release tool. Had intercourse and it went well. Had one spasm, after sitting for a long time. Some soreness  Precautions:  none  Pain update:  Location of pain: L PSIS Current pain: 1/2 /10  Max pain:  1/10 Least pain:   0/10 Nature of pain: achy/dull  Patient Goals: Learn what she can do to stop the spasms and to be able to empty her bladder when it has.   OBJECTIVE  Changes in: Posture/Observations:  Posterior pelvic tilt, forward head and shoulders  Range of Motion/Flexibilty:  decreased mobility greatest at L base, resolved following treatment.  Palpation: TTP to B Piriformis, OI, Glute min. Resolved following treatment  INTERVENTIONS THIS SESSION: Manual: Performed TP release To B Piriformis, OI, Glute min. to decrease spasm and pain and allow for improved balance of musculature for improved function and decreased symptoms and Sacral Mobs to improve mobility of joint and surrounding connective tissue and decrease pressure on nerve roots for improved conductivity and function of down-stream tissues.  Self-care: Educated on vaginal hygiene to decrease odorous D/C and help prevent bacteria as well as differentiate from SUI to assure accurate assessment of improvement.   Therex: Developed and educated on using HEP to continue to see symptom improvement and maintain improvement upon D/C  Total time: 60 min.                            PT Short Term Goals - 12/03/18 1432      PT SHORT TERM GOAL #1   Title  Patient will demonstrate a coordinated contraction,  relaxation, and bulge of the pelvic floor muscles to demonstrate functional recruitment and motion and allow for further strengthening.    Baseline  Pt. having difficulty relaxing PFM to empty bladder.    Time  5    Period  Weeks    Status  Achieved    Target Date  11/19/18      PT SHORT TERM GOAL #2   Title  Patient will demonstrate improved pelvic alignment and balance of musculature surrounding the pelvis to facilitate decreased PFM spasms and decrease pelvic pain.    Baseline  L posterior rotation/upslip and spasms surrounding    Time  5    Period  Weeks    Status  Achieved    Target Date  11/19/18      PT  SHORT TERM GOAL #3   Title  Patient will demonstrate ability to perform self internal TP release in order to facilitate further PFM spasm reduction at home for faster resolution of symptoms    Baseline  Pt. lacks knowledge of how to perform  self TP release to decrease spasms and pain.    Time  5    Period  Weeks    Status  Achieved    Target Date  11/19/18        PT Long Term Goals - 12/04/18 0839      PT LONG TERM GOAL #1   Title  Patient will report no "butt spasms" over past two weeks with increased activity level to demonstrate improved functional ability.    Baseline  had 2 "butt spasms" within 1 week while performing boot camp exercise. As of 7/20: only 1 occurrence over 2 weeks    Time  10    Period  Weeks    Status  Partially Met    Target Date  12/24/18      PT LONG TERM GOAL #2   Title  "Patient will score at or below 14/300 on the PFDI and 10% on the Female NIH-CPSI to demonstrate a clinically meaningful decrease in disability and distress due to pelvic floor dysfunction.    Baseline  Female NIH-CPSI: 10/43 (23%),  PFDI: 29/300     Time  10    Period  Weeks    Status  Unable to assess    Target Date  12/24/18      PT LONG TERM GOAL #3   Title  Pt. will report no incidents of difficulty initiating stream of urine over the past montho demonstrate improved PFM coordination and relaxation and increased QOL.    Baseline  ~2x/month Pt.  will have difficulty emptying bladder in the morning      Time  10    Period  Weeks    Status  Achieved    Target Date  12/24/18            Plan - 12/03/18 1715    Clinical Impression Statement  Pt. responded well to all interventions today demonstrating decreased spasms, improved mobility, and understanding of all new and prior education and exercises. She has made progress toward or met all goals with spasm still occurring rarely but improved understanding of how she can self-treat to manage. Due to finances she will no longer be  able to attend PT, Information given for how to progress at home with demonstration of understanding. We will D/C at this time to HEP.    Personal Factors and Comorbidities  Finances    Examination-Activity Limitations  Toileting  Examination-Participation Restrictions  Interpersonal Relationship    Stability/Clinical Decision Making  Stable/Uncomplicated    Rehab Potential  Good    PT Frequency  1x / week    PT Duration  Other (comment)   10 weeks   PT Treatment/Interventions  ADLs/Self Care Home Management;Biofeedback;Electrical Stimulation;Moist Heat;Traction;Functional mobility training;Therapeutic activities;Therapeutic exercise;Neuromuscular re-education;Manual techniques;Patient/family education;Scar mobilization;Dry needling;Taping;Spinal Manipulations;Joint Manipulations    PT Next Visit Plan  D/C    PT Home Exercise Plan  Piriformis stretch, L side stretch, self internal and external TP release, tool, self assessment, thumb-work. [email or web address]    Consulted and Agree with Plan of Care  Patient       Patient will benefit from skilled therapeutic intervention in order to improve the following deficits and impairments:  Pain, Postural dysfunction, Increased muscle spasms  Visit Diagnosis: 1. Other muscle spasm   2. Abnormal posture        Problem List Patient Active Problem List   Diagnosis Date Noted  . IUD (intrauterine device) in place 12/12/2017   Willa Rough DPT, ATC Willa Rough 12/04/2018, 8:41 AM  [place] MAIN [facility] SERVICES [address] [place], [place], [address] Phone: [phone]   Fax:  [phone]  Name: ANAE HAMS MRN: [id number] Date of Birth: [birth year]

## 2018-12-03 NOTE — Patient Instructions (Addendum)
Your Vagina is Not Cussing! One of the most fascinating things I've learned as a pelvic floor physical therapist is that women really have a variety of ways that they wash their crotch. Should that be "crotches"? Can you make that plural? If not, why not? Tell me that.. But, cleaning the crotch - it's important. We clean our face, our armpits and our feet. The crotch has got a lot going on so it should be cleaned too, right? Women clean themselves differently, but that's not necessarily okay. There are some basic facts that are important to know when it comes to keeping your machine well-oiled and running, regardless of whether she's a Gore or a 2015 [employer]; cuz either way she's a beauty, right? So what is the right way for a woman to clean her vulvovaginal area in order to ensure cleanliness, odor reduction and avoidance of infection? Let's start with what I hear from patients: 1. "I usually douche because that's what my mother did." 2. "I use a lavender scented soap all over my body and I get a wash rag and scrub my vulva." 3. "I spread my labias and get soap on them and then I put soap inside my vagina. I'm very clean." 4. "I'm careful, so I go front to back with the soap. I start at the vulva and soap it real good, then I reach over to my anus and get that soapy." 5. "I use a loofa on my vulva and then after I shower I spray a little perfume down there. You never know what's going to happen that day." Friends, Romans, Countrywomen - lend me your ear! All these people are WRONG! (and that's probably one reason why they're seeing me in the first place) If you want my advice, I'm going to be succinct, clear and direct. You can wash your vulvovaginal area any way you'd like as long as you are in the shower, eliminate all soap and let warm water run over the area and only use your hands. Just call me the Lorene Dy of the vagina.or is that weird?  Here's what I want you to  do: 1. Wash your hair. 2. Wash your body with soap. 3. Rinse everything off. 4. Let warm water rinse over your labias. Yes, you can spread your labias. 5. Let warm water rinse over your anus. 6. Get out of the shower.** 7. Gently and lovingly pat the vulva dry and put on white, cotton underwear. **You can wash your hands before getting out. So why am I so restricting? Here's why: 1. The vagina is self-cleansing. There is no need to douche or soap inside the vagina. It's already got a good bacteria called lactobacilli that has several important functions. Lactobacilli eats up bad bacteria that can cause infection, it keeps the vagina acidic in order to reduce the likelihood of infections and it's even postulated that lactobacilli can prompt the immune system. This helps reduce odor, infection and keeps the natural flora healthy. Oh, and get this - estrogen helps to feed lactobacilli. So if you're low on estrogen, it makes sense that you might be prone to more infections. Please, just don't use soap on the vulva or in the vagina. Trust me, your vagina is not cussing. (Ironically enough, the inside of the mouth is made up of the same durable tissue as the inside of the vagina.) 2. The vulva wasn't meant to be scrubbed - it's not a potato. The vulva is sensitive  like your fingertips, the skin around your eyes and your lips. It's meant to detect fine detail (for pleasure), so being forceful with it is going to make it more sensitive in a negative way - hypersensitive (for pain). Scrubbing can remove a fine layer of the vulvovaginal tissue which can create an anti-histamine response - much like scrubbing your arm would make your arm red. This creates an inflammatory cascade of events. Many people will heal from this quite quickly and may not notice any discomfort, but others may start to notice some irritation after some time. This is when you might start noticing sensitivity to things that never bothered you  before like tight clothes, colorful underwear, lubricants or laundry detergent. 3. Scented items (or items with chemicals) like perfume (on the vulva), soap, bubble bath or even flavored or hot/cold/tingly/prickly/naughty sexual lubricant/condoms should be avoided as well because they could irritate the opening of the vagina (the vulvar vestibule) or the vagina itself. The vulvar vestibule is made of up different tissue than the vagina (but the same tissue as the urethra and bladder), so it's possible that using chemical products here can cause pain and the symptoms of a urinary tract infection (UTI). 4. The vagina needs to breathe. Wearing tight, conforming clothing all the time or daily pantyliners can be suffocating to your vulvovaginal area and irritating to the skin. Give it a break sometimes and wear looser clothing and or no underwear at all (like at night). 5. If you have a sensitive vulva or are prone to a lot of symptoms of infections, consider wearing white, cotton underwear instead of the fancy stuff. Over time, it's possible to develop new allergies and unfortunately, some women develop allergies to synthetic materials and dyes in their underwear. This also means it's best to wash your underwear with a detergent that is made for sensitive skin and is free of chemicals. ** Note - we will expand this area in the near future (with Sara's blessings) to include other options for under wear or safe liners. Stay tuned!  And get this: Discharge doesn't mean you are dirty. Discharge is natural and comes from a variety of places, most of which are not the vagina itself. What you see on your underwear is a mix of oil and gland secretions from the vulva and it's also secretions from the uterus and the fallopian tubes. Discharge changes during different parts of the menstrual cycle because it serves different purposes. For example, when you are ovulating, the discharge is a different consistency so that sperm  can pass through it more easily. It's all normal and healthy. However, if it starts to change colors or smell really funky - this indicates a possible issue with an area that is potentially apart from the vagina. Soaping and scrubbing to high [place] is not going to fix this - you really need to get checked out by a doctor in this situation. Taking care of the vulvovaginal tissue is easier than we want it to be. Less is more. So much more. Good, simple vulvovaginal hygiene means better flora (not fauna), reduced odor, less itching and less discomfort. So cheers to you and your polite vagina. That little number was raised right! -Lavone OrnSara K. Sauder PT, DPT   Access Code: [email or web address]  URL: [email or web address]  Date: 12/03/2018  Prepared by: Flora LippsKeeli    Exercises  Side Stepping with Resistance at Thighs - 10 reps - 3 sets - 1x daily - 7x weekly  Full Plank on Knees -  3 reps - 30 hold - 1x daily - 7x weekly  Supine Bridge with Roll Down on Wall - 15 reps - 2 sets - 1x daily - 7x weekly  Double Leg Hamstring Stretch at Wall - 3 reps - 5 Breaths hold - 1x daily  Stretching Adductors - 3 reps - 5 Breaths Hold - 1x daily  Supine Pelvic Floor Stretch - 3 reps - 5 seconds Hold - 1x daily

## 2018-12-05 ENCOUNTER — Other Ambulatory Visit: Payer: Self-pay

## 2018-12-05 ENCOUNTER — Encounter: Payer: Self-pay | Admitting: Nurse Practitioner

## 2018-12-05 ENCOUNTER — Other Ambulatory Visit: Payer: Self-pay | Admitting: Internal Medicine

## 2018-12-05 ENCOUNTER — Ambulatory Visit (INDEPENDENT_AMBULATORY_CARE_PROVIDER_SITE_OTHER): Payer: 59 | Admitting: Nurse Practitioner

## 2018-12-05 VITALS — Temp 97.6°F

## 2018-12-05 DIAGNOSIS — Z20822 Contact with and (suspected) exposure to covid-19: Secondary | ICD-10-CM

## 2018-12-05 DIAGNOSIS — Z20828 Contact with and (suspected) exposure to other viral communicable diseases: Secondary | ICD-10-CM | POA: Diagnosis not present

## 2018-12-05 NOTE — Patient Instructions (Addendum)
Electa Sniff,   Thank you for coming in to clinic today.  1. [facility] Testing Information I have placed an order in the [facility] system. All you need to do is arrive at a testing site. No appointment needed. Hours (Open 8 a.m. - 3:45 p.m.) LAST TEST completed at 3:30pm  [place]: [facility], [address], [place], [place]: [place], [place], [place], [place] (entrance off [address])  [place]: [place]. Main [address] [place], [place] (across from [facility])  Test result may take 2-7 days to result. You will be notified by MyChart or by Phone. Phone: [phone] [facility] contact, can inquire about status of test result)  2. I recommend you remain out of work until you have a negative test.  It is your option to remain out of work anyway per my recommendation and utilize single episode FMLA.  Please schedule a follow-up appointment with Cassell Smiles, AGNP. Return if symptoms worsen or fail to improve.  If you have any other questions or concerns, please feel free to call the clinic or send a message through [facility]. You may also schedule an earlier appointment if necessary.  You will receive a survey after today's visit either digitally by e-mail or paper by [employer]. Your experiences and feedback matter to [place].  Please respond so we know how we are doing as we provide care for you.   Cassell Smiles, DNP, AGNP-BC Adult Gerontology Nurse Practitioner [place]

## 2018-12-05 NOTE — Progress Notes (Signed)
Telemedicine Encounter: Disclosed to patient at start of encounter that we will provide appropriate telemedicine services.  Patient consents to be treated via phone prior to discussion. - Patient is at her home and is accessed via telephone. - Services are provided by Wilhelmina McardleLauren Vinton Layson from [facility].  Subjective:    Patient ID: Cheryl [name], female    DOB: [birth year], 38 y.o.   MRN: [id number]  Cheryl Melenamily A [name] is a 38 y.o. 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  Patient had a fever and tested Covid positive the day after she worked with him. Her services were required for greater than 30 minutes. He coughed near her while she was providing care on 11/30/2018 (5 days ago).  Irving Burtonmily was wearing a surgical mask, but her patient had no mask/covering.  Her work did not test or require her to stay out of work. Patient is monitoring symptoms closely and reports she currently has no symptoms.  Denies fever, chills, cough, loss of sense of taste/smell, shortness of breath, fatigue, muscle or body aches, headache, sore throat, congestion or runny nose, nausea or vomiting, diarrhea, or skin rash.  Social History   Tobacco Use  . Smoking status: Never Smoker  . Smokeless tobacco: Never Used  Substance Use Topics  . Alcohol use: Yes    Comment: OCC  . Drug use: Never    Review of Systems Per HPI unless specifically indicated above     Objective:    Temp 97.6 F (36.4 C) (Oral)   Wt Readings from Last 3 Encounters:  07/13/18 170 lb (77.1 kg)  06/15/18 170 lb (77.1 kg)  04/24/18 171 lb 9.6 oz (77.8 kg)    Physical Exam Patient remotely monitored.  Verbal communication appropriate.  Cognition normal.  Results for orders placed or performed in visit on 04/24/18  Lipid panel  Result Value Ref Range   Cholesterol 185 <200 mg/dL   HDL 64 >40>50 mg/dL   Triglycerides 57 <981<150 mg/dL   LDL Cholesterol (Calc) 107 (H) mg/dL (calc)   Total CHOL/HDL Ratio 2.9 <5.0 (calc)   Non-HDL Cholesterol (Calc) 121 <130 mg/dL (calc)  BASIC METABOLIC PANEL WITH GFR  Result Value Ref Range   Glucose, Bld 94 65 - 99 mg/dL   BUN 17 7 - 25 mg/dL   Creat 1.910.93 4.780.50 - 2.951.10 mg/dL   GFR, Est Non African American 79 > OR = 60 mL/min/1.2973m2   GFR, Est African American 92 > OR = 60 mL/min/1.1873m2   BUN/Creatinine Ratio NOT APPLICABLE 6 - 22 (calc)   Sodium 139 135 - 146 mmol/L   Potassium 4.2 3.5 - 5.3 mmol/L   Chloride 104 98 - 110 mmol/L   CO2 25 20 - 32 mmol/L   Calcium 9.2 8.6 - 10.2 mg/dL      Assessment & Plan:   Problem List Items Addressed This Visit    None    Visit Diagnoses    Close Exposure to Covid-19 Virus    -  Primary    Close exposure, not currently symptomatic of Covid-19.   - Self-isolate and quarantine is recommended until negative test result is received.  - Testing is ordered today. Information provided to patient. - Follow-up prn if symptoms occur.  Reviewed need to seek care for complications of Covid.   - Time spent in direct consultation with patient via telemedicine about above concerns:  8 minutes  Follow up plan: Return if symptoms worsen or fail to improve.  Cassell Smiles, DNP, AGPCNP-BC Adult Gerontology Primary Care Nurse Practitioner [facility] 12/05/2018, 8:08 AM

## 2018-12-09 LAB — NOVEL CORONAVIRUS, NAA: SARS-CoV-2, NAA: NOT DETECTED

## 2018-12-10 ENCOUNTER — Ambulatory Visit: Payer: 59

## 2019-07-17 ENCOUNTER — Ambulatory Visit: Payer: Self-pay

## 2019-07-17 ENCOUNTER — Other Ambulatory Visit: Payer: Self-pay | Admitting: Family Medicine

## 2019-07-17 ENCOUNTER — Other Ambulatory Visit: Payer: Self-pay

## 2019-07-17 DIAGNOSIS — M545 Low back pain, unspecified: Secondary | ICD-10-CM

## 2019-07-24 ENCOUNTER — Other Ambulatory Visit: Payer: Self-pay

## 2019-07-24 ENCOUNTER — Encounter: Payer: Self-pay | Admitting: Family Medicine

## 2019-07-24 ENCOUNTER — Ambulatory Visit (INDEPENDENT_AMBULATORY_CARE_PROVIDER_SITE_OTHER): Payer: No Typology Code available for payment source | Admitting: Family Medicine

## 2019-07-24 VITALS — BP 117/57 | HR 67 | Temp 97.1°F | Ht 67.0 in | Wt 174.0 lb

## 2019-07-24 DIAGNOSIS — M545 Low back pain, unspecified: Secondary | ICD-10-CM

## 2019-07-24 DIAGNOSIS — Z0000 Encounter for general adult medical examination without abnormal findings: Secondary | ICD-10-CM

## 2019-07-24 DIAGNOSIS — G8929 Other chronic pain: Secondary | ICD-10-CM

## 2019-07-24 DIAGNOSIS — Z1283 Encounter for screening for malignant neoplasm of skin: Secondary | ICD-10-CM | POA: Diagnosis not present

## 2019-07-24 MED ORDER — CYCLOBENZAPRINE HCL 5 MG PO TABS: 5.0000 mg | ORAL_TABLET | Freq: Three times a day (TID) | ORAL | 1 refills | Status: DC | PRN

## 2019-07-24 NOTE — Assessment & Plan Note (Signed)
Annual physical exam with new findings of low back pain on exam.  Will refer to physical therapy for additional stretches/strengthening.  Well adult with no acute concerns.  Plan: 1. Obtain health maintenance screenings as above according to age. - Increase physical activity to 30 minutes most days of the week.  - Eat healthy diet high in vegetables and fruits; low in refined carbohydrates. - Screening labs and tests as ordered 2. Return 1 year for annual physical. 3. Have labs drawn today and we will contact you with the results 4. Referral placed for physical therapy, have this scheduled 5. Referral placed for dermatology, have this scheduled

## 2019-07-24 NOTE — Progress Notes (Signed)
Subjective:    Patient ID: Cheryl [name], female    DOB: 1980/06/21, 39 y.o.   MRN: [id number]  Cheryl [name] is a 39 y.o. female presenting on 07/24/2019 for Annual Exam (lower back pain that only when bending. The patient injured her back tranferring a patient Nov 11th. )   HPI  Annual Physical Exam Patient has been feeling well.  They have no acute concerns today. Sleeps well at night with minimal interruptions.  HEALTH MAINTENANCE:  Weight/BMI:  Physical activity: 3 lb weight gain since July 2021 Diet: Regular diet Seatbelt: 100% of the time when in a vehicle Sunscreen: Yes PAP: Follows with CNM Rushie Goltz with a negative PAP 12/12/2017 Skin exam: Has followed with Dermatology in the past, has moles on back would like to see dermatology for HIV Screening: Offered and declined, had with her pregnancies (65 + 93 year old children) Hepatitis Screening: Offered and declined Optometry: Going next month, usually goes yearly Dentistry: Goes every 6 months  IMMUNIZATIONS: Influenza: UP TO DATE: 01/10/2019 Tetanus: UP TO DATE: 09/07/2009 COVID: COMPLETED: 05/25/2019 + 06/14/2019  Depression screen PHQ 2/9 12/05/2018  Decreased Interest 0  Down, Depressed, Hopeless 2  PHQ - 2 Score 2  Altered sleeping 0  Tired, decreased energy 1  Change in appetite 0  Feeling bad or failure about yourself  0  Trouble concentrating 1  Moving slowly or fidgety/restless 0  Suicidal thoughts 0  PHQ-9 Score 4  Difficult doing work/chores Somewhat difficult    Past Medical History:  Diagnosis Date  . Bacterial pneumonia    age 26  . Postpartum hemorrhage    G1  . Urinary retention    after second delivery   Past Surgical History:  Procedure Laterality Date  . MANDIBLE SURGERY  1999  . TONSILLECTOMY AND ADENOIDECTOMY     middle school age  . WISDOM TOOTH EXTRACTION     middle school/high school - all four   Social History   Socioeconomic History  . Marital status: Married      Spouse name: Not on file  . Number of children: 2  . Years of education: 90  . Highest education level: Doctorate  Occupational History  . Occupation: PHYSICAL THERAPIST    Comment: [employer]  Tobacco Use  . Smoking status: Never Smoker  . Smokeless tobacco: Never Used  Substance and Sexual Activity  . Alcohol use: Yes    Comment: OCC  . Drug use: Never  . Sexual activity: Yes    Birth control/protection: I.U.D.    Comment: Mirena  Other Topics Concern  . Not on file  Social History Narrative  . Not on file   Social Determinants of Health   Financial Resource Strain:   . Difficulty of Paying Living Expenses: Not on file  Food Insecurity:   . Worried About Programme researcher, broadcasting/film/video in the Last Year: Not on file  . Ran Out of Food in the Last Year: Not on file  Transportation Needs:   . Lack of Transportation (Medical): Not on file  . Lack of Transportation (Non-Medical): Not on file  Physical Activity:   . Days of Exercise per Week: Not on file  . Minutes of Exercise per Session: Not on file  Stress:   . Feeling of Stress : Not on file  Social Connections:   . Frequency of Communication with Friends and Family: Not on file  . Frequency of Social Gatherings with Friends and Family: Not on  file  . Attends Religious Services: Not on file  . Active Member of Clubs or Organizations: Not on file  . Attends Archivist Meetings: Not on file  . Marital Status: Not on file  Intimate Partner Violence:   . Fear of Current or Ex-Partner: Not on file  . Emotionally Abused: Not on file  . Physically Abused: Not on file  . Sexually Abused: Not on file   Family History  Problem Relation Age of Onset  . Diabetes Mother        type 2 and GDM  . Hypertension Mother   . Deep vein thrombosis Mother   . Hypertension Father   . Diabetes Maternal Grandmother   . Heart disease Maternal Grandmother   . Mental illness Brother   . Healthy Daughter   . Healthy Daughter    . Alzheimer's disease Paternal Grandfather    Current Outpatient Medications on File Prior to Visit  Medication Sig  . ibuprofen (ADVIL,MOTRIN) 200 MG tablet Take 200 mg by mouth as needed.  Marland Kitchen levonorgestrel (MIRENA) 20 MCG/24HR IUD 1 each by Intrauterine route once.   No current facility-administered medications on file prior to visit.    Per HPI unless specifically indicated above     Objective:    BP (!) 117/57 (BP Location: Right Arm, Patient Position: Sitting, Cuff Size: Normal)   Pulse 67   Temp (!) 97.1 F (36.2 C) (Temporal)   Ht 5\' 7"  (1.702 m)   Wt 174 lb (78.9 kg)   BMI 27.25 kg/m   Wt Readings from Last 3 Encounters:  07/24/19 174 lb (78.9 kg)  07/13/18 170 lb (77.1 kg)  06/15/18 170 lb (77.1 kg)    Physical Exam Vitals reviewed.  Constitutional:      General: She is not in acute distress.    Appearance: Normal appearance. She is well-groomed and overweight. She is not ill-appearing or toxic-appearing.  HENT:     Head: Normocephalic.     Right Ear: Tympanic membrane, ear canal and external ear normal. There is no impacted cerumen.     Left Ear: Tympanic membrane, ear canal and external ear normal. There is no impacted cerumen.     Nose: Nose normal. No congestion or rhinorrhea.     Mouth/Throat:     Lips: Pink.     Mouth: Mucous membranes are moist.     Pharynx: Oropharynx is clear. Uvula midline. No oropharyngeal exudate or posterior oropharyngeal erythema.  Eyes:     General: Lids are normal. Vision grossly intact. No scleral icterus.       Right eye: No discharge.        Left eye: No discharge.     Extraocular Movements: Extraocular movements intact.     Conjunctiva/sclera: Conjunctivae normal.     Pupils: Pupils are equal, round, and reactive to light.  Neck:     Thyroid: No thyroid mass or thyromegaly.  Cardiovascular:     Rate and Rhythm: Normal rate and regular rhythm.     Pulses: Normal pulses.          Dorsalis pedis pulses are 2+ on the  right side and 2+ on the left side.       Posterior tibial pulses are 2+ on the right side and 2+ on the left side.     Heart sounds: Normal heart sounds. No murmur. No friction rub. No gallop.   Pulmonary:     Effort: Pulmonary effort is normal. No respiratory distress.  Breath sounds: Normal breath sounds.  Abdominal:     General: Abdomen is flat. Bowel sounds are normal. There is no distension.     Palpations: Abdomen is soft. There is no hepatomegaly, splenomegaly or mass.     Tenderness: There is no abdominal tenderness. There is no guarding or rebound.     Hernia: No hernia is present.  Musculoskeletal:        General: Normal range of motion.     Cervical back: Normal range of motion and neck supple. No tenderness.     Right lower leg: No edema.     Left lower leg: No edema.  Feet:     Right foot:     Skin integrity: Skin integrity normal.     Left foot:     Skin integrity: Skin integrity normal.  Lymphadenopathy:     Cervical: No cervical adenopathy.  Skin:    General: Skin is warm and dry.     Capillary Refill: Capillary refill takes less than 2 seconds.  Neurological:     General: No focal deficit present.     Mental Status: She is alert and oriented to person, place, and time.     Cranial Nerves: No cranial nerve deficit.     Sensory: No sensory deficit.     Motor: No weakness.     Coordination: Coordination normal.     Gait: Gait normal.     Deep Tendon Reflexes: Reflexes normal.  Psychiatric:        Attention and Perception: Attention and perception normal.        Mood and Affect: Mood and affect normal.        Speech: Speech normal.        Behavior: Behavior normal. Behavior is cooperative.        Thought Content: Thought content normal.        Cognition and Memory: Cognition and memory normal.        Judgment: Judgment normal.     Results for orders placed or performed in visit on 12/05/18  Novel Coronavirus, NAA ([facility])  Result Value Ref Range    SARS-CoV-2, NAA Not Detected Not Detected      Assessment & Plan:   Problem List Items Addressed This Visit      Other   Routine medical exam    Annual physical exam with new findings of low back pain on exam.  Will refer to physical therapy for additional stretches/strengthening.  Well adult with no acute concerns.  Plan: 1. Obtain health maintenance screenings as above according to age. - Increase physical activity to 30 minutes most days of the week.  - Eat healthy diet high in vegetables and fruits; low in refined carbohydrates. - Screening labs and tests as ordered 2. Return 1 year for annual physical. 3. Have labs drawn today and we will contact you with the results 4. Referral placed for physical therapy, have this scheduled 5. Referral placed for dermatology, have this scheduled       Relevant Orders   CBC with Differential   COMPLETE METABOLIC PANEL WITH GFR   Lipid Profile    Other Visit Diagnoses    Screening for malignant neoplasm of skin    -  Primary   Relevant Orders   Ambulatory referral to Dermatology   Chronic bilateral low back pain without sciatica       Relevant Medications   cyclobenzaprine (FLEXERIL) 5 MG tablet   Other Relevant Orders   Ambulatory  referral to Physical Therapy      Meds ordered this encounter  Medications  . cyclobenzaprine (FLEXERIL) 5 MG tablet    Sig: Take 1 tablet (5 mg total) by mouth 3 (three) times daily as needed for muscle spasms.    Dispense:  30 tablet    Refill:  1      Follow up plan: Return in about 1 year (around 07/23/2020) for Physical.  Comprehensive dental screening reviewed (oral exam, prophylactic dental cleaning, and dental x-rays) once yearly in all adults.  May repeat every 6 months if clinically recommended.  Charlaine Dalton, FNP-C Family Nurse Practitioner [facility] 07/24/2019, 8:43 AM

## 2019-07-24 NOTE — Patient Instructions (Signed)
Have your labs drawn and we will contact you once we receive the results.  I have put in the referrals to Dermatology and Physical Therapy as we discussed.  Have sent in a prescription for Cyclobenzaprine, as we discussed, to help with muscle spasms/low back pain that can interrupt sleep.  Be sure to avoid driving or operating heavy machinery while taking this medication.  We will plan to see you back in 1 year for your next physical and wellness exam.  You will receive a survey after today's visit either digitally by e-mail or paper by [employer]. Your experiences and feedback matter to [place].  Please respond so we know how we are doing as we provide care for you.  Call us with any questions/concerns/needs.  It is my goal to be available to you for your health concerns.  Thanks for choosing me to be a partner in your healthcare needs!  Charlaine Dalton, FNP-C Family Nurse Practitioner [facility] Phone: [phone]

## 2019-07-25 LAB — COMPLETE METABOLIC PANEL WITH GFR
AG Ratio: 2 (calc) (ref 1.0–2.5)
ALT: 10 U/L (ref 6–29)
AST: 13 U/L (ref 10–30)
Albumin: 4.6 g/dL (ref 3.6–5.1)
Alkaline phosphatase (APISO): 51 U/L (ref 31–125)
BUN: 15 mg/dL (ref 7–25)
CO2: 27 mmol/L (ref 20–32)
Calcium: 9.4 mg/dL (ref 8.6–10.2)
Chloride: 105 mmol/L (ref 98–110)
Creat: 0.79 mg/dL (ref 0.50–1.10)
GFR, Est African American: 110 mL/min/{1.73_m2} (ref >=60)
GFR, Est Non African American: 95 mL/min/{1.73_m2} (ref >=60)
Globulin: 2.3 g/dL (calc) (ref 1.9–3.7)
Glucose, Bld: 88 mg/dL (ref 65–99)
Potassium: 4 mmol/L (ref 3.5–5.3)
Sodium: 140 mmol/L (ref 135–146)
Total Bilirubin: 0.7 mg/dL (ref 0.2–1.2)
Total Protein: 6.9 g/dL (ref 6.1–8.1)

## 2019-07-25 LAB — CBC WITH DIFFERENTIAL/PLATELET
Absolute Monocytes: 339 cells/uL (ref 200–950)
Basophils Absolute: 48 cells/uL (ref 0–200)
Basophils Relative: 0.9 %
Eosinophils Absolute: 80 cells/uL (ref 15–500)
Eosinophils Relative: 1.5 %
HCT: 43.1 % (ref 35.0–45.0)
Hemoglobin: 14.1 g/dL (ref 11.7–15.5)
Lymphs Abs: 1749 cells/uL (ref 850–3900)
MCH: 29.1 pg (ref 27.0–33.0)
MCHC: 32.7 g/dL (ref 32.0–36.0)
MCV: 89 fL (ref 80.0–100.0)
MPV: 9.8 fL (ref 7.5–12.5)
Monocytes Relative: 6.4 %
Neutro Abs: 3085 cells/uL (ref 1500–7800)
Neutrophils Relative %: 58.2 %
Platelets: 197 10*3/uL (ref 140–400)
RBC: 4.84 10*6/uL (ref 3.80–5.10)
RDW: 12.7 % (ref 11.0–15.0)
Total Lymphocyte: 33 %
WBC: 5.3 10*3/uL (ref 3.8–10.8)

## 2019-07-25 LAB — LIPID PANEL
Cholesterol: 171 mg/dL (ref <200)
HDL: 69 mg/dL (ref >=50)
LDL Cholesterol (Calc): 83 mg/dL (calc)
Non-HDL Cholesterol (Calc): 102 mg/dL (calc) (ref <130)
Total CHOL/HDL Ratio: 2.5 (calc) (ref <5.0)
Triglycerides: 92 mg/dL (ref <150)

## 2019-07-25 NOTE — Progress Notes (Signed)
Labs are all within normal limits.  Will plan to see her back in 1 year for next wellness visit.

## 2019-08-26 ENCOUNTER — Telehealth: Payer: Self-pay

## 2019-08-26 NOTE — Telephone Encounter (Signed)
The pt called today requesting to change her Dermatology referral over to [facility] instead of [facility].   She also notified us that she sprain her Left ankle on April 8th stepping off of a curb and twisted her ankle. She was seen at [facility] and diagnose with a sprain ankle. 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

## 2019-08-26 NOTE — Telephone Encounter (Signed)
Request sent to [facility] to find out if she can switch this or if I need to put in a new referral.  Thanks

## 2019-08-27 ENCOUNTER — Other Ambulatory Visit: Payer: Self-pay | Admitting: Family Medicine

## 2019-08-27 ENCOUNTER — Telehealth: Payer: Self-pay

## 2019-08-27 DIAGNOSIS — S93402A Sprain of unspecified ligament of left ankle, initial encounter: Secondary | ICD-10-CM

## 2019-08-27 NOTE — Telephone Encounter (Signed)
The pt called requesting a referral to [facility] she sprain her Left ankle on April 8th stepping off of a curb and twisted her ankle. She was seen at [facility] and diagnose with a sprain ankle. 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

## 2019-08-27 NOTE — Telephone Encounter (Signed)
Referral placed.

## 2019-08-27 NOTE — Progress Notes (Signed)
Reported sprain of left ankle and has established care with [facility].  Patient contacted office for referral.  Referral placed.

## 2020-06-24 ENCOUNTER — Telehealth: Payer: Self-pay

## 2020-06-24 ENCOUNTER — Ambulatory Visit: Payer: Self-pay | Admitting: *Deleted

## 2020-06-24 ENCOUNTER — Telehealth (INDEPENDENT_AMBULATORY_CARE_PROVIDER_SITE_OTHER): Payer: No Typology Code available for payment source | Admitting: Family Medicine

## 2020-06-24 ENCOUNTER — Encounter: Payer: Self-pay | Admitting: Family Medicine

## 2020-06-24 ENCOUNTER — Other Ambulatory Visit: Payer: Self-pay | Admitting: Family Medicine

## 2020-06-24 VITALS — Wt 175.0 lb

## 2020-06-24 DIAGNOSIS — J029 Acute pharyngitis, unspecified: Secondary | ICD-10-CM

## 2020-06-24 MED ORDER — AMOXICILLIN 500 MG PO CAPS: 500.0000 mg | ORAL_CAPSULE | Freq: Two times a day (BID) | ORAL | 0 refills | Status: DC

## 2020-06-24 NOTE — Telephone Encounter (Signed)
Copied from CRM [id number]. Topic: General - Other >> Jun 24, 2020  8:07 AM Jaquita Rector A wrote: Reason for CRM: Patient called I trying to get an appointment to be seen today to check for strep throat. Asking if she can get a call back say that she was already checked for Covid but it was negative. Please call Ph# [phone]

## 2020-06-24 NOTE — Patient Instructions (Addendum)
  It sounds most like a Viral Pharyngitis (Sore Throat) caused by a Virus - this will most likely run it's course in 5 to 10 days. Wash hands good to prevent spread of virus. - For Sore throat, take Ibuprofen 400-600mg  every 6-8 hours (3 times a day) with food, and you may also take Tylenol 500-1000mg  per dose every 6-8 hours (3 times a day) between ibuprofen doses as needed - Drink extra clear fluids (water, or G2 gatorade), try colder soft foods if needed otherwise regular diet - Drink warm herbal tea with honey to help reduce sore throat swelling - You can also try to cover for potential allergy symptoms that often make sore throat worse due to post-nasal drainage     - Try over the counter Nasal Saline spray (Simply Saline, Ocean Spray) as needed to reduce congestion     - Try Loratadine (Claritin) 10mg  daily for up to 4 weeks     - Flonase steroid nasal spray (OTC) 2 sprays in each nostril daily for 4 weeks  STREP TEST DONE TODAY IF POSITIVE OR IF SYMPTOMS WORSEN - Take antibiotic Amoxicillin 500mg  twice a day for 10 days, finish complete course   There is a chance that this is more of a Viral Pharyngitis, in which case it will take time to run it's course regardless, and may have some hoarseness or throat pain lasting for up to 7-10 days then improve   Please schedule a Follow-up Appointment to: Return in about 1 week (around 07/01/2020), or if symptoms worsen or fail to improve.  If you have any other questions or concerns, please feel free to call the office or send a message through MyChart. You may also schedule an earlier appointment if necessary.  Additionally, you may be receiving a survey about your experience at our office within a few days to 1 week by e-mail or mail. We value your feedback.  , DO [facility], Saralyn Pilar

## 2020-06-24 NOTE — Telephone Encounter (Signed)
Summary: Advice - possible strep throat   PT thinks she has strep throat, states she is unable to get any help or get any appts. PT needs advice because her throat is very sore and she is very concerned. [phone]      Patient is calling to report that she thinks she may have strep- patient was tested for COVID and that test was negative. Patient has some sinus drainage- but no other symptoms. Call to office- no appointment available- request note for possible work in virtual at some time today- patient is aware she will get call back and will be ready. Reason for Disposition . [1] Sore throat is the only symptom AND [2] present > 48 hours  Answer Assessment - Initial Assessment Questions 1. ONSET: "When did the throat start hurting?" (Hours or days ago)      yesterday 2. SEVERITY: "How bad is the sore throat?" (Scale 1-10; mild, moderate or severe)   - MILD (1-3):  doesn't interfere with eating or normal activities   - MODERATE (4-7): interferes with eating some solids and normal activities   - SEVERE (8-10):  excruciating pain, interferes with most normal activities   - SEVERE DYSPHAGIA: can't swallow liquids, drooling     moderate 3. STREP EXPOSURE: "Has there been any exposure to strep within the past week?" If Yes, ask: "What type of contact occurred?"      None known 4.  VIRAL SYMPTOMS: "Are there any symptoms of a cold, such as a runny nose, cough, hoarse voice or red eyes?"      Sinus drainage, cough 5. FEVER: "Do you have a fever?" If Yes, ask: "What is your temperature, how was it measured, and when did it start?"     no 6. PUS ON THE TONSILS: "Is there pus on the tonsils in the back of your throat?"     No white patches 7. OTHER SYMPTOMS: "Do you have any other symptoms?" (e.g., difficulty breathing, headache, rash)     no 8. PREGNANCY: "Is there any chance you are pregnant?" "When was your last menstrual period?"     no  Protocols used: SORE THROAT-A-AH

## 2020-06-24 NOTE — Progress Notes (Signed)
Virtual Visit via Telephone The purpose of this virtual visit is to provide medical care while limiting exposure to the novel coronavirus (COVID19) for both patient and office staff.  Consent was obtained for phone visit:  Yes.   Answered questions that patient had about telehealth interaction:  Yes.   I discussed the limitations, risks, security and privacy concerns of performing an evaluation and management service by telephone. I also discussed with the patient that there may be a patient responsible charge related to this service. The patient expressed understanding and agreed to proceed.  Patient Location: Home Provider Location: Cheryl [name] (Office)  Participants in virtual visit: - Patient: Cheryl [name] - CMA: Cheryl [name], CMA - Provider: Dr Althea [name]  ---------------------------------------------------------------------- Chief Complaint  Patient presents with  . Sore Throat    S: Reviewed CMA documentation. I have called patient and gathered additional HPI as follows:  SORE THROAT / PHARYNGITIS Reports that symptoms started yesterday 06/23/20 woke up with sore throat, she is a [employer] employee and was screened out of work and had to be tested for COVID19, had PCR test negative yesterday, now here for virtual visit for sore throat. Worse pain last night with sore throat, has post nasal drainage and hoarse voice,  - Additional sick contacts, 2 weeks ago husband had COVID and oldest daughter had COVID and asymptomatic. Cheryl [name] had tested about 3 times all negative for COVID  Patient is currently out of work due to symptoms  Denies any fevers, chills, sweats, body ache, cough, shortness of breath, sinus pain or pressure, headache, abdominal pain, diarrhea  Updated on COVID vaccine. Booster dose done 03/2020   Past Medical History:  Diagnosis Date  . Bacterial pneumonia    age 15  . Postpartum hemorrhage    G1  . Urinary retention    after second  delivery   Social History   Tobacco Use  . Smoking status: Never Smoker  . Smokeless tobacco: Never Used  Vaping Use  . Vaping Use: Never used  Substance Use Topics  . Alcohol use: Yes    Comment: OCC  . Drug use: Never    Current Outpatient Medications:  .  amoxicillin (AMOXIL) 500 MG capsule, Take 1 capsule (500 mg total) by mouth 2 (two) times daily. For 10 days, Disp: 20 capsule, Rfl: 0 .  ibuprofen (ADVIL,MOTRIN) 200 MG tablet, Take 200 mg by mouth as needed., Disp: , Rfl:  .  levonorgestrel (MIRENA) 20 MCG/24HR IUD, 1 each by Intrauterine route once., Disp: , Rfl:  .  cyclobenzaprine (FLEXERIL) 5 MG tablet, Take 1 tablet (5 mg total) by mouth 3 (three) times daily as needed for muscle spasms. (Patient not taking: Reported on 06/24/2020), Disp: 30 tablet, Rfl: 1  Depression screen [facility] 2/9 12/05/2018  Decreased Interest 0  Down, Depressed, Hopeless 2  PHQ - 2 Score 2  Altered sleeping 0  Tired, decreased energy 1  Change in appetite 0  Feeling bad or failure about yourself  0  Trouble concentrating 1  Moving slowly or fidgety/restless 0  Suicidal thoughts 0  PHQ-9 Score 4  Difficult doing work/chores Somewhat difficult    No flowsheet data found.  -------------------------------------------------------------------------- O: No physical exam performed due to remote telephone encounter.  Lab results reviewed.  No results found for this or any previous visit (from the past 2160 hour(s)).  -------------------------------------------------------------------------- A&P:  Problem List Items Addressed This Visit   None   Visit Diagnoses    Acute  pharyngitis, unspecified etiology    -  Primary   Relevant Medications   amoxicillin (AMOXIL) 500 MG capsule     Acute pharyngitis Cannot rule out viral Negative COVID test yesterday PCR No other viral symptoms, will check strep test today  1. No rapid test available. Proceed with Group A strep culture - follow-up 3.  Hold antibiotics now but given no rapid test, and goal to return to work soon - will offer empiric antibiotic with Amoxicillin 500mg  BID x 10 days, will adjust based on improvement and culture results - start only if not improved within 48 hours, or if positive culture 4. Symptomatic control with NSAID / Tylenol regularly then PRN 5. Improve hydration, advance diet, warm herbal tea with honey 6. Follow-up within 1 week if worsening    Meds ordered this encounter  Medications  . amoxicillin (AMOXIL) 500 MG capsule    Sig: Take 1 capsule (500 mg total) by mouth 2 (two) times daily. For 10 days    Dispense:  20 capsule    Refill:  0    Follow-up: - Return in 1 week if not improved   Patient verbalizes understanding with the above medical recommendations including the limitation of remote medical advice.  Specific follow-up and call-back criteria were given for patient to follow-up or seek medical care more urgently if needed.   - Time spent in direct consultation with patient on phone: 12 minutes  , DO [facility] 06/24/2020, 9:32 AM

## 2020-06-24 NOTE — Telephone Encounter (Signed)
The pt was notified that we currently do not have any appointment available. Recommended that she go to the Urgent care or Acute care for treatment. She verbalize understanding, no questions or concerns.

## 2020-06-27 LAB — CULTURE, GROUP A STREP

## 2020-09-08 ENCOUNTER — Encounter: Payer: Self-pay | Admitting: Family Medicine

## 2020-09-08 ENCOUNTER — Other Ambulatory Visit: Payer: Self-pay

## 2020-09-08 ENCOUNTER — Ambulatory Visit (INDEPENDENT_AMBULATORY_CARE_PROVIDER_SITE_OTHER): Payer: No Typology Code available for payment source | Admitting: Family Medicine

## 2020-09-08 VITALS — BP 118/74 | HR 75 | Ht 67.0 in | Wt 172.0 lb

## 2020-09-08 DIAGNOSIS — M5441 Lumbago with sciatica, right side: Secondary | ICD-10-CM

## 2020-09-08 DIAGNOSIS — M5416 Radiculopathy, lumbar region: Secondary | ICD-10-CM

## 2020-09-08 DIAGNOSIS — M5442 Lumbago with sciatica, left side: Secondary | ICD-10-CM | POA: Diagnosis not present

## 2020-09-08 DIAGNOSIS — G8929 Other chronic pain: Secondary | ICD-10-CM

## 2020-09-08 MED ORDER — GABAPENTIN 100 MG PO CAPS: ORAL_CAPSULE | ORAL | 1 refills | Status: DC

## 2020-09-08 NOTE — Progress Notes (Addendum)
Subjective:    Patient ID: Cheryl [name], female    DOB: 1980/12/28, 40 y.o.   MRN: [id number]  Cheryl [name] is a 40 y.o. female presenting on 09/08/2020 for Back Pain (Started 1.5 years ago. Progressively getting worse. Doing PT and not helping. Cannot sit long periods of times, and pain is radiating down right leg. )  Previous PCP Danielle Rankin, FNP  Unable to do wellness/physical eval today. Focus is on chronic problem back pain.  HPI   LOW BACK PAIN, bilateral now R>L with sciatica - Reports symptoms started about 1.5 years ago with inciting injury heavy lifting with transferring patient.  Now worsening with radicular neurological symptoms   In past she was followed by prior PCP Danielle Rankin, FNP and she was given PT referral in the past but she did not pursue this. She has done some evaluation with coworkers PT and she has done some exercises at home.  Today seems to be worsening gradually. Describes pain as a constant 3 out of 10 pain and if prolonged sitting provoked can increase to more moderate 6 out of 10 pain. Radiating into legs left side into gluteal region and Right side into lower leg and into calf. Worse with prolong sitting and forward bending, feels like "back may give out" on her. She has difficulty lifting leg and bending to tie shoes. Worse in past 2-3 months. Her flexibility has worsened. Worsening with prolonged sitting, onset a few months ago. Now at work she is having issues with flexibility and bending, difficulty getting down on floor to work with patients.  She works as a Adult nurse.  Improved with walking, exercises, prone press-ups, and extension helps.  - Taking OTC Ibuprofen few doses PRN. She had old rx from last year Hydrocodone / Flexeril without relief and felt groggy sedated tired. - Tried topical Deep Blue without much relief - Fam history of DDD with prior diskectomy surgery both Father and Mother at younger ages.  - No clear history of  prior lumbar issue, had prior X-ray - Admits difficulty sleeping due to pain - finding comfortable position - Denies any fevers/chills, numbness, tingling, weakness, loss of control bladder/bowel incontinence or retention, unintentional wt loss, night sweats   Depression screen [facility] 2/9 09/08/2020 12/05/2018  Decreased Interest 0 0  Down, Depressed, Hopeless 0 2  PHQ - 2 Score 0 2  Altered sleeping 2 0  Tired, decreased energy 2 1  Change in appetite 0 0  Feeling bad or failure about yourself  0 0  Trouble concentrating 0 1  Moving slowly or fidgety/restless 0 0  Suicidal thoughts 0 0  PHQ-9 Score 4 4  Difficult doing work/chores Not difficult at all Somewhat difficult    Social History   Tobacco Use  . Smoking status: Never Smoker  . Smokeless tobacco: Never Used  Vaping Use  . Vaping Use: Never used  Substance Use Topics  . Alcohol use: Yes    Comment: OCC  . Drug use: Never    Review of Systems Per HPI unless specifically indicated above     Objective:    BP 118/74   Pulse 75   Ht 5\' 7"  (1.702 m)   Wt 172 lb (78 kg)   SpO2 100%   BMI 26.94 kg/m   Wt Readings from Last 3 Encounters:  09/08/20 172 lb (78 kg)  06/24/20 175 lb (79.4 kg)  07/24/19 174 lb (78.9 kg)    Physical Exam Vitals and nursing  note reviewed.  Constitutional:      General: She is not in acute distress.    Appearance: She is well-developed. She is not diaphoretic.     Comments: Well-appearing, comfortable, cooperative  HENT:     Head: Normocephalic and atraumatic.  Eyes:     General:        Right eye: No discharge.        Left eye: No discharge.     Conjunctiva/sclera: Conjunctivae normal.  Cardiovascular:     Rate and Rhythm: Normal rate.  Pulmonary:     Effort: Pulmonary effort is normal.  Musculoskeletal:     Comments: Low Back Inspection: Normal appearance, thin body habitus, no spinal deformity, symmetrical. Palpation: No reproducible tenderness over spinous processes.  Bilateral lumbar paraspinal muscles non-tender and without hypertonicity/spasm. Area of tenderness near R SI and midline ROM: Full active ROM forward flex / back extension, rotation L/R without discomfort Special Testing: Seated slump test SLR positive for R radicular pain. Seated facet load negative. Strength: Bilateral hip flex/ext 5/5, knee flex/ext 5/5, ankle dorsiflex/plantarflex 5/5 Neurovascular: intact distal sensation to light touch   Skin:    General: Skin is warm and dry.     Findings: No erythema or rash.  Neurological:     Mental Status: She is alert and oriented to person, place, and time.  Psychiatric:        Behavior: Behavior normal.     Comments: Well groomed, good eye contact, normal speech and thoughts      I have personally reviewed the radiology report from 07/17/19 Lumbar X-ray.  CLINICAL DATA:  Low back pain since lifting injury on 03/27/2019.  EXAM: LUMBAR SPINE - COMPLETE 4+ VIEW  COMPARISON:  None.  FINDINGS: This report assumes 5 non rib-bearing lumbar vertebrae.  Lumbar vertebral body heights are preserved, with no fracture.  Minimal loss of disc height at L3-4 without significant spondylosis. Otherwise preserved lumbar disc spaces. No spondylolisthesis. No appreciable facet arthropathy. No aggressive appearing focal osseous lesions.  IMPRESSION: Minimal loss of disc height at L3-4. Otherwise normal lumbar spine radiographs.   Electronically Signed   By: Delbert Phenix M.D.   On: 07/17/2019 10:02  Results for orders placed or performed in visit on 06/24/20  Culture, Group A Strep  Result Value Ref Range   Strep A Culture Comment (A)       Assessment & Plan:   Problem List Items Addressed This Visit    Chronic low back pain with bilateral sciatica - Primary   Relevant Medications   gabapentin (NEURONTIN) 100 MG capsule    Other Visit Diagnoses    Lumbar radicular pain       Relevant Medications   gabapentin (NEURONTIN) 100  MG capsule      Subacute on chronic R>L LBP with associated bilateral sciatica / radicular pain. Prior history of heavy lifting injury 1.5 years ago onset. Chronic episodic problem since. Positive R SLR for radiculopathy - Inadequate conservative therapy  Failed short course hydrocodone, flexeril, OTC meds Tylenol  Plan: 1. Start Gabapentin titration, up to 100-300mg  QHS or TID PRN dosing. Caution sedation. 2. Consider trial still on OTC Tylenol, NSAID PRN 3. Encouraged use of heating pad 1-2x daily for now then PRN 4. Offered Prednisone dosepak taper 6-7 days short term treatment/relief for radicular symptoms.  Will refer promptly to [facility] once I hear back from patient who they prefer to see. She will notify me this week on MyChart/call with  more info. Ultimately will need advanced imaging MRI, may need updated X-ray last done 07/2019 as reviewed.  NOTE UPDATE 09/09/20 at 1221pm Patient responded on mychart with name Dr Delice Lesch referral to [facility] now  Orders Placed This Encounter  Procedures  . Ambulatory referral to Neurosurgery    Referral Priority:   Routine    Referral Type:   Surgical    Referral Reason:   Specialty Services Required    Requested Specialty:   Neurosurgery    Number of Visits Requested:   1      Meds ordered this encounter  Medications  . gabapentin (NEURONTIN) 100 MG capsule    Sig: Start 1 capsule daily, increase by 1 cap every 2-3 days as tolerated up to 3 times a day, or may take 3 at once in evening.    Dispense:  90 capsule    Refill:  1      Follow up plan: Return in about 4 weeks (around 10/06/2020), or if symptoms worsen or fail to improve, for back pain as needed - future physical 6 wk to 3 months w/ Rene Kocher.   Saralyn Pilar, DO [facility] 09/08/2020, 4:23 PM

## 2020-09-08 NOTE — Patient Instructions (Addendum)
Thank you for coming to the office today.  Start Gabapentin 100mg  capsules, take at night for 2-3 nights only, and then increase to 2 times a day for a few days, and then may increase to 3 times a day, it may make you drowsy, if helps significantly at night only, then you can increase instead to 3 capsules at night, instead of 3 times a day - In the future if needed, we can significantly increase the dose if tolerated well, some common doses are 300mg  three times a day up to 600mg  three times a day, usually it takes several weeks or months to get to higher doses  If in future want to try 6-7 day taper Prednisone, contact me  Let me know on MyChart which provider at Neurosurgery - we can send referral. In future if wait time is longer, let me know Plan B.  Please schedule a Follow-up Appointment to: Return in about 4 weeks (around 10/06/2020), or if symptoms worsen or fail to improve, for back pain as needed - future physical 6 wk to 3 months w/ .  If you have any other questions or concerns, please feel free to call the office or send a message through MyChart. You may also schedule an earlier appointment if necessary.  Additionally, you may be receiving a survey about your experience at our office within a few days to 1 week by e-mail or mail. We value your feedback.  [place], DO [facility], Rene Kocher

## 2020-09-09 ENCOUNTER — Encounter: Payer: Self-pay | Admitting: Family Medicine

## 2020-09-09 NOTE — Addendum Note (Signed)
Addended by: Smitty Cords on: 09/09/2020 12:21 PM   Modules accepted: Orders

## 2020-10-14 DIAGNOSIS — G8929 Other chronic pain: Secondary | ICD-10-CM

## 2020-10-14 DIAGNOSIS — M5416 Radiculopathy, lumbar region: Secondary | ICD-10-CM

## 2020-10-14 MED ORDER — PREDNISONE 20 MG PO TABS: ORAL_TABLET | ORAL | 0 refills | Status: DC

## 2020-10-21 ENCOUNTER — Inpatient Hospital Stay (HOSPITAL_COMMUNITY): Payer: No Typology Code available for payment source | Admitting: Certified Registered Nurse Anesthetist

## 2020-10-21 ENCOUNTER — Other Ambulatory Visit (HOSPITAL_COMMUNITY): Payer: Self-pay | Admitting: Radiology

## 2020-10-21 ENCOUNTER — Inpatient Hospital Stay (HOSPITAL_COMMUNITY)
Admission: EM | Admit: 2020-10-21 | Discharge: 2020-10-22 | DRG: 520 | Disposition: A | Discharge status: Home / Self Care | Payer: No Typology Code available for payment source | Attending: Neurological Surgery | Admitting: Neurological Surgery

## 2020-10-21 ENCOUNTER — Encounter (HOSPITAL_COMMUNITY): Payer: Self-pay | Admitting: Emergency Medicine

## 2020-10-21 ENCOUNTER — Other Ambulatory Visit: Payer: Self-pay

## 2020-10-21 ENCOUNTER — Encounter (HOSPITAL_COMMUNITY)
Admission: EM | Disposition: A | Discharge status: Home / Self Care | Payer: Self-pay | Source: Home / Self Care | Attending: Neurological Surgery

## 2020-10-21 DIAGNOSIS — Z419 Encounter for procedure for purposes other than remedying health state, unspecified: Secondary | ICD-10-CM

## 2020-10-21 DIAGNOSIS — M5416 Radiculopathy, lumbar region: Principal | ICD-10-CM | POA: Diagnosis present

## 2020-10-21 DIAGNOSIS — R531 Weakness: Secondary | ICD-10-CM | POA: Diagnosis present

## 2020-10-21 DIAGNOSIS — M48061 Spinal stenosis, lumbar region without neurogenic claudication: Secondary | ICD-10-CM | POA: Diagnosis present

## 2020-10-21 DIAGNOSIS — M4807 Spinal stenosis, lumbosacral region: Secondary | ICD-10-CM | POA: Diagnosis present

## 2020-10-21 DIAGNOSIS — Z20822 Contact with and (suspected) exposure to covid-19: Secondary | ICD-10-CM | POA: Diagnosis present

## 2020-10-21 DIAGNOSIS — M5442 Lumbago with sciatica, left side: Secondary | ICD-10-CM

## 2020-10-21 HISTORY — PX: LUMBAR LAMINECTOMY/ DECOMPRESSION WITH MET-RX: SHX5959

## 2020-10-21 LAB — CBC WITH DIFFERENTIAL/PLATELET
Abs Immature Granulocytes: 0.1 10*3/uL — ABNORMAL HIGH (ref 0.00–0.07)
Basophils Absolute: 0.1 10*3/uL (ref 0.0–0.1)
Basophils Relative: 1 %
Eosinophils Absolute: 0.1 10*3/uL (ref 0.0–0.5)
Eosinophils Relative: 1 %
HCT: 44.8 % (ref 36.0–46.0)
Hemoglobin: 14.4 g/dL (ref 12.0–15.0)
Immature Granulocytes: 1 %
Lymphocytes Relative: 32 %
Lymphs Abs: 3.9 10*3/uL (ref 0.7–4.0)
MCH: 29.3 pg (ref 26.0–34.0)
MCHC: 32.1 g/dL (ref 30.0–36.0)
MCV: 91.2 fL (ref 80.0–100.0)
Monocytes Absolute: 0.7 10*3/uL (ref 0.1–1.0)
Monocytes Relative: 5 %
Neutro Abs: 7.3 10*3/uL (ref 1.7–7.7)
Neutrophils Relative %: 60 %
Platelets: 236 10*3/uL (ref 150–400)
RBC: 4.91 MIL/uL (ref 3.87–5.11)
RDW: 13 % (ref 11.5–15.5)
WBC: 12.1 10*3/uL — ABNORMAL HIGH (ref 4.0–10.5)
nRBC: 0 % (ref 0.0–0.2)

## 2020-10-21 LAB — I-STAT BETA HCG BLOOD, ED (MC, WL, AP ONLY): I-stat hCG, quantitative: <5 m[IU]/mL (ref <5)

## 2020-10-21 LAB — BASIC METABOLIC PANEL
Anion gap: 11 (ref 5–15)
BUN: 12 mg/dL (ref 6–20)
CO2: 24 mmol/L (ref 22–32)
Calcium: 9.1 mg/dL (ref 8.9–10.3)
Chloride: 103 mmol/L (ref 98–111)
Creatinine, Ser: 0.96 mg/dL (ref 0.44–1.00)
GFR, Estimated: >60 mL/min (ref >60)
Glucose, Bld: 88 mg/dL (ref 70–99)
Potassium: 3.3 mmol/L — ABNORMAL LOW (ref 3.5–5.1)
Sodium: 138 mmol/L (ref 135–145)

## 2020-10-21 LAB — RESP PANEL BY RT-PCR (FLU A&B, COVID) ARPGX2
Influenza A by PCR: NEGATIVE
Influenza B by PCR: NEGATIVE
SARS Coronavirus 2 by RT PCR: NEGATIVE

## 2020-10-21 SURGERY — LUMBAR LAMINECTOMY/ DECOMPRESSION WITH MET-RX
Anesthesia: General | Site: Back | Laterality: Right

## 2020-10-21 MED ORDER — PHENOL 1.4 % MT LIQD: 1.0000 | OROMUCOSAL | Status: DC | PRN

## 2020-10-21 MED ORDER — DEXAMETHASONE SODIUM PHOSPHATE 10 MG/ML IJ SOLN
INTRAMUSCULAR | Status: DC | PRN
  Administered 2020-10-21: 10 mg via INTRAVENOUS

## 2020-10-21 MED ORDER — CYCLOBENZAPRINE HCL 10 MG PO TABS: 10.0000 mg | ORAL_TABLET | Freq: Three times a day (TID) | ORAL | Status: DC | PRN

## 2020-10-21 MED ORDER — LACTATED RINGERS IV SOLN: INTRAVENOUS | Status: DC | PRN

## 2020-10-21 MED ORDER — OXYCODONE HCL 5 MG PO TABS
5.0000 mg | ORAL_TABLET | ORAL | Status: DC | PRN
  Administered 2020-10-22: 5 mg via ORAL
  Filled 2020-10-21: qty 1

## 2020-10-21 MED ORDER — PROPOFOL 10 MG/ML IV BOLUS
INTRAVENOUS | Status: AC
  Filled 2020-10-21: qty 20

## 2020-10-21 MED ORDER — ONDANSETRON HCL 4 MG/2ML IJ SOLN: 4.0000 mg | Freq: Four times a day (QID) | INTRAMUSCULAR | Status: DC | PRN

## 2020-10-21 MED ORDER — MENTHOL 3 MG MT LOZG: 1.0000 | LOZENGE | OROMUCOSAL | Status: DC | PRN

## 2020-10-21 MED ORDER — POLYETHYLENE GLYCOL 3350 17 G PO PACK: 17.0000 g | PACK | Freq: Every day | ORAL | Status: DC | PRN

## 2020-10-21 MED ORDER — LIDOCAINE-EPINEPHRINE 1 %-1:100000 IJ SOLN
INTRAMUSCULAR | Status: AC
  Filled 2020-10-21: qty 1

## 2020-10-21 MED ORDER — SUGAMMADEX SODIUM 200 MG/2ML IV SOLN
INTRAVENOUS | Status: DC | PRN
  Administered 2020-10-21: 300 mg via INTRAVENOUS

## 2020-10-21 MED ORDER — FENTANYL CITRATE (PF) 100 MCG/2ML IJ SOLN
INTRAMUSCULAR | Status: DC | PRN
  Administered 2020-10-21 – 2020-10-22 (×2): 50 ug via INTRAVENOUS

## 2020-10-21 MED ORDER — ONDANSETRON HCL 4 MG/2ML IJ SOLN
INTRAMUSCULAR | Status: DC | PRN
  Administered 2020-10-21: 4 mg via INTRAVENOUS

## 2020-10-21 MED ORDER — THROMBIN 5000 UNITS EX SOLR
OROMUCOSAL | Status: DC | PRN
  Administered 2020-10-21: 5 mL via TOPICAL

## 2020-10-21 MED ORDER — FENTANYL CITRATE (PF) 250 MCG/5ML IJ SOLN
INTRAMUSCULAR | Status: AC
  Filled 2020-10-21: qty 5

## 2020-10-21 MED ORDER — CEFAZOLIN SODIUM-DEXTROSE 2-4 GM/100ML-% IV SOLN
2.0000 g | Freq: Three times a day (TID) | INTRAVENOUS | Status: AC
  Administered 2020-10-21 – 2020-10-22 (×2): 2 g via INTRAVENOUS
  Filled 2020-10-21 (×3): qty 100

## 2020-10-21 MED ORDER — SODIUM CHLORIDE 0.9% FLUSH: 3.0000 mL | INTRAVENOUS | Status: DC | PRN

## 2020-10-21 MED ORDER — PROPOFOL 10 MG/ML IV BOLUS
INTRAVENOUS | Status: DC | PRN
  Administered 2020-10-21: 120 mg via INTRAVENOUS

## 2020-10-21 MED ORDER — PHENYLEPHRINE HCL (PRESSORS) 10 MG/ML IV SOLN
INTRAVENOUS | Status: DC | PRN
  Administered 2020-10-21: 80 ug via INTRAVENOUS

## 2020-10-21 MED ORDER — DOCUSATE SODIUM 100 MG PO CAPS
100.0000 mg | ORAL_CAPSULE | Freq: Two times a day (BID) | ORAL | Status: DC
  Administered 2020-10-22: 100 mg via ORAL
  Filled 2020-10-21: qty 1

## 2020-10-21 MED ORDER — 0.9 % SODIUM CHLORIDE (POUR BTL) OPTIME
TOPICAL | Status: DC | PRN
  Administered 2020-10-21: 1000 mL

## 2020-10-21 MED ORDER — LIDOCAINE-EPINEPHRINE 1 %-1:100000 IJ SOLN
INTRAMUSCULAR | Status: DC | PRN
  Administered 2020-10-21: 10 mL via INTRADERMAL

## 2020-10-21 MED ORDER — SODIUM CHLORIDE 0.9 % IV SOLN
250.0000 mL | INTRAVENOUS | Status: DC
  Administered 2020-10-22: 250 mL via INTRAVENOUS

## 2020-10-21 MED ORDER — LIDOCAINE HCL (CARDIAC) PF 100 MG/5ML IV SOSY
PREFILLED_SYRINGE | INTRAVENOUS | Status: DC | PRN
  Administered 2020-10-21: 20 mg via INTRAVENOUS

## 2020-10-21 MED ORDER — MIDAZOLAM HCL 5 MG/5ML IJ SOLN
INTRAMUSCULAR | Status: DC | PRN
  Administered 2020-10-21: 2 mg via INTRAVENOUS

## 2020-10-21 MED ORDER — ACETAMINOPHEN 325 MG PO TABS
650.0000 mg | ORAL_TABLET | ORAL | Status: DC | PRN
  Administered 2020-10-22: 650 mg via ORAL
  Filled 2020-10-21: qty 2

## 2020-10-21 MED ORDER — PREDNISONE 20 MG PO TABS: ORAL_TABLET | ORAL | 0 refills | Status: DC

## 2020-10-21 MED ORDER — MIDAZOLAM HCL 2 MG/2ML IJ SOLN
INTRAMUSCULAR | Status: AC
  Filled 2020-10-21: qty 2

## 2020-10-21 MED ORDER — HYDROMORPHONE HCL 1 MG/ML IJ SOLN
1.0000 mg | INTRAMUSCULAR | Status: DC | PRN
  Administered 2020-10-22: 1 mg via INTRAVENOUS
  Filled 2020-10-21: qty 1

## 2020-10-21 MED ORDER — ONDANSETRON HCL 4 MG PO TABS: 4.0000 mg | ORAL_TABLET | Freq: Four times a day (QID) | ORAL | Status: DC | PRN

## 2020-10-21 MED ORDER — THROMBIN 5000 UNITS EX SOLR
CUTANEOUS | Status: AC
  Filled 2020-10-21: qty 5000

## 2020-10-21 MED ORDER — OXYCODONE HCL 5 MG PO TABS
10.0000 mg | ORAL_TABLET | ORAL | Status: DC | PRN
Start: 2020-10-21 — End: 2020-10-22
  Administered 2020-10-21 – 2020-10-22 (×2): 10 mg via ORAL
  Filled 2020-10-21 (×2): qty 2

## 2020-10-21 MED ORDER — SODIUM CHLORIDE 0.9% FLUSH
3.0000 mL | Freq: Two times a day (BID) | INTRAVENOUS | Status: DC
  Administered 2020-10-22: 3 mL via INTRAVENOUS

## 2020-10-21 MED ORDER — ROCURONIUM BROMIDE 100 MG/10ML IV SOLN
INTRAVENOUS | Status: DC | PRN
  Administered 2020-10-21: 20 mg via INTRAVENOUS
  Administered 2020-10-21: 50 mg via INTRAVENOUS

## 2020-10-21 MED ORDER — ACETAMINOPHEN 650 MG RE SUPP: 650.0000 mg | RECTAL | Status: DC | PRN

## 2020-10-21 SURGICAL SUPPLY — 49 items
BAND RUBBER #18 3X1/16 STRL (MISCELLANEOUS) ×6 IMPLANT
BLADE CLIPPER SURG (BLADE) IMPLANT
BLADE SURG 11 STRL SS (BLADE) ×3 IMPLANT
BUR PRECISION FLUTE 5.0 (BURR) IMPLANT
BUR PRECISION MATCH 3.0 13 (BURR) ×2 IMPLANT
BUR PRECISION MATCH 3.0 13CM (BURR) ×1
CANISTER SUCT 3000ML PPV (MISCELLANEOUS) ×3 IMPLANT
COVER WAND RF STERILE (DRAPES) ×3 IMPLANT
DECANTER SPIKE VIAL GLASS SM (MISCELLANEOUS) ×3 IMPLANT
DERMABOND ADVANCED (GAUZE/BANDAGES/DRESSINGS) ×2
DERMABOND ADVANCED .7 DNX12 (GAUZE/BANDAGES/DRESSINGS) ×1 IMPLANT
DRAPE C-ARM 42X72 X-RAY (DRAPES) ×6 IMPLANT
DRAPE LAPAROTOMY 100X72X124 (DRAPES) ×3 IMPLANT
DRAPE MICROSCOPE LEICA (MISCELLANEOUS) ×3 IMPLANT
DRAPE SURG 17X23 STRL (DRAPES) ×3 IMPLANT
DURAPREP 26ML APPLICATOR (WOUND CARE) ×3 IMPLANT
ELECT BLADE 6.5 EXT (BLADE) ×3 IMPLANT
ELECT REM PT RETURN 9FT ADLT (ELECTROSURGICAL) ×3
ELECTRODE REM PT RTRN 9FT ADLT (ELECTROSURGICAL) ×1 IMPLANT
GAUZE 4X4 16PLY RFD (DISPOSABLE) IMPLANT
GAUZE SPONGE 4X4 12PLY STRL (GAUZE/BANDAGES/DRESSINGS) IMPLANT
GLOVE BIOGEL PI IND STRL 7.5 (GLOVE) ×1 IMPLANT
GLOVE BIOGEL PI INDICATOR 7.5 (GLOVE) ×2
GLOVE ECLIPSE 7.5 STRL STRAW (GLOVE) ×3 IMPLANT
GLOVE EXAM NITRILE LRG STRL (GLOVE) IMPLANT
GLOVE EXAM NITRILE XL STR (GLOVE) IMPLANT
GLOVE EXAM NITRILE XS STR PU (GLOVE) IMPLANT
GOWN STRL REUS W/ TWL LRG LVL3 (GOWN DISPOSABLE) ×2 IMPLANT
GOWN STRL REUS W/ TWL XL LVL3 (GOWN DISPOSABLE) IMPLANT
GOWN STRL REUS W/TWL 2XL LVL3 (GOWN DISPOSABLE) IMPLANT
GOWN STRL REUS W/TWL LRG LVL3 (GOWN DISPOSABLE) ×4
GOWN STRL REUS W/TWL XL LVL3 (GOWN DISPOSABLE)
HEMOSTAT POWDER KIT SURGIFOAM (HEMOSTASIS) ×3 IMPLANT
KIT BASIN OR (CUSTOM PROCEDURE TRAY) ×3 IMPLANT
KIT TURNOVER KIT B (KITS) ×3 IMPLANT
NDL SPNL 18GX3.5 QUINCKE PK (NEEDLE) ×1 IMPLANT
NEEDLE HYPO 22GX1.5 SAFETY (NEEDLE) ×3 IMPLANT
NEEDLE SPNL 18GX3.5 QUINCKE PK (NEEDLE) ×3 IMPLANT
NS IRRIG 1000ML POUR BTL (IV SOLUTION) ×3 IMPLANT
PACK LAMINECTOMY NEURO (CUSTOM PROCEDURE TRAY) ×3 IMPLANT
PAD ARMBOARD 7.5X6 YLW CONV (MISCELLANEOUS) ×9 IMPLANT
SPONGE LAP 4X18 RFD (DISPOSABLE) IMPLANT
SUT MNCRL AB 3-0 PS2 18 (SUTURE) ×2 IMPLANT
SUT VIC AB 0 CT1 18XCR BRD8 (SUTURE) IMPLANT
SUT VIC AB 0 CT1 8-18 (SUTURE)
SUT VIC AB 2-0 CP2 18 (SUTURE) ×3 IMPLANT
TOWEL GREEN STERILE (TOWEL DISPOSABLE) ×3 IMPLANT
TOWEL GREEN STERILE FF (TOWEL DISPOSABLE) ×3 IMPLANT
WATER STERILE IRR 1000ML POUR (IV SOLUTION) ×3 IMPLANT

## 2020-10-21 NOTE — ED Notes (Signed)
RN attempted report x3 

## 2020-10-21 NOTE — Anesthesia Procedure Notes (Signed)
Procedure Name: Intubation Date/Time: 10/21/2020 10:44 PM Performed by: Rhoderick Farrel T, CRNA Pre-anesthesia Checklist: Patient identified, Emergency Drugs available, Suction available and Patient being monitored Patient Re-evaluated:Patient Re-evaluated prior to induction Oxygen Delivery Method: Circle system utilized Preoxygenation: Pre-oxygenation with 100% oxygen Induction Type: IV induction Ventilation: Mask ventilation without difficulty Laryngoscope Size: Glidescope and 3 Grade View: Grade I Tube type: Oral Tube size: 7.0 mm Number of attempts: 1 Airway Equipment and Method: Stylet and Oral airway Placement Confirmation: ETT inserted through vocal cords under direct vision,  positive ETCO2 and breath sounds checked- equal and bilateral Secured at: 21 cm Tube secured with: Tape Dental Injury: Teeth and Oropharynx as per pre-operative assessment

## 2020-10-21 NOTE — Op Note (Signed)
PATIENT: Cheryl [name]  DAY OF SURGERY: 10/21/20   PRE-OPERATIVE DIAGNOSIS:  Herniated nucleus pulposus with lumbar radiculopathy   POST-OPERATIVE DIAGNOSIS:  Herniated nucleus pulposus with lumbar radiculopathy   PROCEDURE:  Right MIS L5-S1 discectomy   SURGEON:  Surgeon(s) and Role:    Jadene Pierini, MD - Primary      ANESTHESIA: ETGA   BRIEF HISTORY: This is a 40 year old woman who presented with acute on subacute radiculopathy with progressive severe pain and right plantarflexion weakness.  I therefore recommended a minimally invasive L5-S1 MIS discectomy. This was discussed with the patient as well as risks, benefits, and alternatives and the patient wished to proceed with surgical treatment.    OPERATIVE DETAIL: The patient was taken to the operating room and placed on the OR table in the prone position. A formal time out was performed with two patient identifiers and confirmed the operative site. Anesthesia was induced by the anesthesia team. The operative site was marked, hair was clipped with surgical clippers, the area was then prepped and draped in a sterile fashion. Fluoroscopy was used to identify the surgical level.   An 89mm incision was then marked 1.5cm off to the right of midline. The fascia was incised sharply and serial dilators were docked to the laminofacet junction of L5. A final 18mm tubular distractor was placed and secured to the table and fluoro again confirmed the correct surgical level. Using a high speed drill and rongeurs, a hemilaminotomy and medial facetectomy were performed followed by removal of the ligamentum flavum. There was a large disc herniation present underneath the traversing nerve root, which was mobilized with a nerve hook and gently retracted medially using a suction-retractor to expose the disc space. There was already an annulotomy present, this was explored and a very large, free disc fragment was removed. The area was explored and there  were no clear residual free fragments on exploration of the annulotomy. The nerve root and thecal sac were well decompressed in the surgical field, the wound was copiously irrigated and hemostasis was obtained then confirmed.  The tube was removed while using the microscope to confirm hemostasis of the muscle edges. All instrument and sponge counts were correct and the incision was then closed in layers. The patient was then returned to anesthesia for emergence. No apparent complications at the completion of the procedure.    EBL:  32mL   DRAINS: none   SPECIMENS: none   Jadene Pierini, MD 10/21/20 10:01 PM

## 2020-10-21 NOTE — ED Provider Notes (Addendum)
Emergency Medicine Provider Triage Evaluation Note  Cheryl [name] , a 40 y.o. female  was evaluated in triage.  Pt complains of back pain ongoing for months. Pain radiates down the RLE and she has noticed some weakness to the rle over the last few months. She was told by Dr. Laretta Alstrom office to come to the ED as he would likely operate tonight.  Review of Systems  Positive: back pain, weakness Negative: no loss of control of bowel or bladder function  Physical Exam  BP 133/86 (BP Location: Left Arm)   Pulse 99   Temp 98.8 F (37.1 C) (Oral)   Resp 14   SpO2 97%  Gen:   Awake, no distress   Resp:  Normal effort  MSK:   Moves extremities without difficulty  Other:  Standing in room, 5/5 strength noted to ble, no focal ttp to the right lower back  Medical Decision Making  Medically screening exam initiated at 2:43 PM.  Appropriate orders placed.  Cheryl [name] was informed that the remainder of the evaluation will be completed by another provider, this initial triage assessment does not replace that evaluation, and the importance of remaining in the ED until their evaluation is complete.   3:17 PM CONSULT with Dr. Maurice Small who will admit the patient to short stay. Recommends labs and covid test. He will put in admission orders   Cheryl Meres, PA-C 10/21/20 1449    Cheryl Bown S, PA-C 10/21/20 1602    Cathren Laine, MD 10/23/20 1610

## 2020-10-21 NOTE — Anesthesia Preprocedure Evaluation (Signed)
Anesthesia Evaluation  Patient identified by MRN, date of birth, ID band Patient awake    Reviewed: Allergy & Precautions, H&P , NPO status , Patient's Chart, lab work & pertinent test results  Airway Mallampati: III  TM Distance: >3 FB Neck ROM: Full    Dental no notable dental hx. (+) Teeth Intact, Dental Advisory Given   Pulmonary neg pulmonary ROS,    Pulmonary exam normal breath sounds clear to auscultation       Cardiovascular negative cardio ROS   Rhythm:Regular Rate:Normal     Neuro/Psych negative neurological ROS  negative psych ROS   GI/Hepatic negative GI ROS, Neg liver ROS,   Endo/Other  negative endocrine ROS  Renal/GU negative Renal ROS  negative genitourinary   Musculoskeletal   Abdominal   Peds  Hematology negative hematology ROS (+)   Anesthesia Other Findings   Reproductive/Obstetrics negative OB ROS                             Anesthesia Physical Anesthesia Plan  ASA: I and emergent  Anesthesia Plan: General   Post-op Pain Management:    Induction: Intravenous  PONV Risk Score and Plan: 4 or greater and Ondansetron, Dexamethasone and Midazolam  Airway Management Planned: Oral ETT  Additional Equipment:   Intra-op Plan:   Post-operative Plan: Extubation in OR  Informed Consent: I have reviewed the patients History and Physical, chart, labs and discussed the procedure including the risks, benefits and alternatives for the proposed anesthesia with the patient or authorized representative who has indicated his/her understanding and acceptance.     Dental advisory given  Plan Discussed with: CRNA  Anesthesia Plan Comments:         Anesthesia Quick Evaluation

## 2020-10-21 NOTE — ED Triage Notes (Signed)
Severe lower back pain that radiates down R leg x 4-5 months.  MRI 2 1/2 weeks ago.  Spoke with her neurosurgeon and told to come to ED to likely have surgery tonight.  Denies incontinence.

## 2020-10-21 NOTE — Brief Op Note (Signed)
10/21/2020  11:54 PM  PATIENT:  Cheryl [name]  40 y.o. female  PRE-OPERATIVE DIAGNOSIS:  Lumbar Radiculopathy  POST-OPERATIVE DIAGNOSIS:  Lumbar Radiculopathy  PROCEDURE:  Procedure(s): RIGHT Lumbar five - Sacral one MIS DISCECTOMY WITH MET-RX (Right)  SURGEON:  Surgeon(s) and Role:    * Judith Part, MD - Primary  PHYSICIAN ASSISTANT:   ASSISTANTS: none   ANESTHESIA:   general  EBL:  5cc  BLOOD ADMINISTERED:none  DRAINS: none   LOCAL MEDICATIONS USED:  LIDOCAINE   SPECIMEN:  No Specimen  DISPOSITION OF SPECIMEN:  N/A  COUNTS:  YES  TOURNIQUET:  * No tourniquets in log *  DICTATION: .Note written in EPIC  PLAN OF CARE: Admit to inpatient   PATIENT DISPOSITION:  PACU - hemodynamically stable.   Delay start of Pharmacological VTE agent (>24hrs) due to surgical blood loss or risk of bleeding: yes

## 2020-10-21 NOTE — H&P (Signed)
H&P  HPI: 40 y.o. woman with acute on chronic low back and right lower extremity pain with progressive weakness. Over the past few days, her pain has and weakness have progressed, so I advised her to come to the ED.   PMHx:  Past Medical History:  Diagnosis Date  . Bacterial pneumonia    age 40  . Postpartum hemorrhage    G1  . Urinary retention    after second delivery   FamHx:  Family History  Problem Relation Age of Onset  . Diabetes Mother        type 2 and GDM  . Hypertension Mother   . Deep vein thrombosis Mother   . Hypertension Father   . Diabetes Maternal Grandmother   . Heart disease Maternal Grandmother   . Mental illness Brother   . Healthy Daughter   . Healthy Daughter   . Alzheimer's disease Paternal Grandfather    SocHx:  reports that she has never smoked. She has never used smokeless tobacco. She reports current alcohol use. She reports that she does not use drugs.  Physical Exam: AOx3, PERRL, FS, TM  Strength 5/5 x4 except 4/5 in R PF, SILTx4  Assesment/Plan: 40 y.o. woman with progressive RLE lumbar radiculopathy, MRI shows small L L4-5 foraminal / extraforaminal HNP, large R L5-S1 HNP with compression of the right S1 nerve root. Presented today with worsening pain and weakness, I therefore recommend a right L5-S1 MIS discectomy. Risks, benefits, and alternatives discussed and the patient would like to continue with surgery.  -OR today -can return to 5N post-op  Jadene Pierini, MD 10/21/20 3:22 PM

## 2020-10-21 NOTE — ED Notes (Signed)
RN attempted report x1.  

## 2020-10-21 NOTE — ED Notes (Addendum)
RN attempted report x2 

## 2020-10-22 ENCOUNTER — Encounter (HOSPITAL_COMMUNITY): Payer: Self-pay | Admitting: Neurological Surgery

## 2020-10-22 ENCOUNTER — Inpatient Hospital Stay (HOSPITAL_COMMUNITY): Payer: No Typology Code available for payment source

## 2020-10-22 MED ORDER — OXYCODONE HCL 5 MG PO TABS: 5.0000 mg | ORAL_TABLET | ORAL | 0 refills | Status: DC | PRN

## 2020-10-22 MED ORDER — CYCLOBENZAPRINE HCL 10 MG PO TABS: 10.0000 mg | ORAL_TABLET | Freq: Three times a day (TID) | ORAL | 0 refills | Status: DC | PRN

## 2020-10-22 MED ORDER — HYDROMORPHONE HCL 1 MG/ML IJ SOLN: 0.2500 mg | INTRAMUSCULAR | Status: DC | PRN

## 2020-10-22 NOTE — Plan of Care (Signed)

## 2020-10-22 NOTE — Progress Notes (Signed)
Provided discharge education/instructions, all questions and concerns addressed, Pt not in distress, discharged home with belongings accompanied by husband. 

## 2020-10-22 NOTE — Evaluation (Signed)
Physical Therapy Evaluation & Discharge Patient Details Name: Cheryl [name] MRN: [id number] DOB: 17-Sep-1980 Today's Date: 10/22/2020   History of Present Illness  Pt is a 40 y.o. female who presented 6/8 with chronic low back pain and R lower extremity pain with progressive weakness. MRI shows small L L4-5 foraminal / extraforaminal HNP, large R L5-S1 HNP with compression of the right S1 nerve root. S/p R L5-S1 MIS discectomy 6/8. No significant past medical hx.   Clinical Impression  Pt presents with condition above. 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 Currently, pt is functioning close to her baseline, demonstrating capability to perform all functional mobility independently. She displays slightly decreased gait speed and slightly increased R hip abduction and external rotation with swing phase, likely due to her pain. As pt is close to her baseline and all education has been completed with questions answered, pt does not need follow-up PT and acute PT will sign off at this time.    Follow Up Recommendations No PT follow up    Equipment Recommendations  None recommended by PT    Recommendations for Other Services       Precautions / Restrictions Precautions Precautions: Fall;Back Precaution Booklet Issued: Yes (comment) Precaution Comments: Reviewed BLT rules with pt following throughout session Required Braces or Orthoses: Other Brace (no brace needed per orders) Restrictions Weight Bearing Restrictions: No      Mobility  Bed Mobility Overal bed mobility: Modified Independent             General bed mobility comments: Bed flat with pt pulling on bed rail to log roll to side and then bring legs off bed while ascending trunk to maintain spinal precautions. Extra time due to pain but pt performing safely.    Transfers Overall transfer level:  Independent Equipment used: None             General transfer comment: Pt able to transfers sit <> stand with slightly increased time due to pain but no LOB.  Ambulation/Gait Ambulation/Gait assistance: Supervision Gait Distance (Feet): 650 Feet Assistive device: None Gait Pattern/deviations: Step-through pattern (Slightly increased R hip abduction and external rotation with swing phase) Gait velocity: reduced Gait velocity interpretation: >2.62 ft/sec, indicative of community ambulatory General Gait Details: Pt with fairly symmetric gait pattern, displaying slightly increased R hip abduction and external rotation with swing phase. No LOB, supervision for safety. Pt able to change head positions and speed, including coming to immediate stop, without changes in her gait pattern or balance.  Stairs Stairs: Yes Stairs assistance: Supervision Stair Management: One rail Right;Step to pattern;Forwards Number of Stairs: 10 General stair comments: Ascends and descends with R rail and step-to pattern, leading up with L leg and down with R without cues. Supervision for safety, no LOB noted. Slightly increased time due to pain.  Wheelchair Mobility    Modified Rankin (Stroke Patients Only)       Balance Overall balance assessment: No apparent balance deficits (not formally assessed)                                           Pertinent Vitals/Pain Pain Assessment: 0-10 Pain Score: 5  Pain Location: lower back, R leg Pain Descriptors / Indicators: Grimacing;Guarding;Aching;Sore Pain Intervention(s): Limited activity within  patient's tolerance;Monitored during session;Repositioned    Home Living Family/patient expects to be discharged to:: Private residence Living Arrangements: Children;Spouse/significant other (7 y.o and 82 y.o.) Available Help at Discharge: Family;Available 24 hours/day Type of Home: House Home Access:  (a "lip" to enter)     Home Layout: Two  level;Bed/bath upstairs Home Equipment: Other (comment);Shower seat - built in [employer])      Prior Function Level of Independence: Independent         Comments: 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     Hand Dominance   Dominant Hand: Right    Extremity/Trunk Assessment   Upper Extremity Assessment Upper Extremity Assessment: Overall WFL for tasks assessed    Lower Extremity Assessment Lower Extremity Assessment: RLE deficits/detail RLE Deficits / Details: Pain limiting strength minimally with MMT scores of grossly 4 to 4+ compared to 5 on L RLE Sensation: WNL (denies numbness/tingling or any "shooting" pain down leg this date) RLE Coordination: WNL    Cervical / Trunk Assessment Cervical / Trunk Assessment: Other exceptions Cervical / Trunk Exceptions: s/p spinal surgery  Communication   Communication: No difficulties  Cognition Arousal/Alertness: Awake/alert Behavior During Therapy: WFL for tasks assessed/performed Overall Cognitive Status: Within Functional Limits for tasks assessed                                 General Comments: A&Ox4.      General Comments General comments (skin integrity, edema, etc.): Educated pt verbally on some exercises to perform at home and on safe car transfers.    Exercises     Assessment/Plan    PT Assessment Patent does not need any further PT services  PT Problem List         PT Treatment Interventions      PT Goals (Current goals can be found in the Care Plan section)  Acute Rehab PT Goals Patient Stated Goal: to go home and get better to eventually return to work PT Goal Formulation: With patient/family Time For Goal Achievement: 10/23/20 Potential to Achieve Goals: Good    Frequency     Barriers to discharge        Co-evaluation               AM-PAC PT "6 Clicks" Mobility  Outcome Measure Help needed turning from your back to your side while in a flat bed  without using bedrails?: None Help needed moving from lying on your back to sitting on the side of a flat bed without using bedrails?: None Help needed moving to and from a bed to a chair (including a wheelchair)?: None Help needed standing up from a chair using your arms (e.g., wheelchair or bedside chair)?: None Help needed to walk in hospital room?: A Little Help needed climbing 3-5 steps with a railing? : A Little 6 Click Score: 22    End of Session Equipment Utilized During Treatment: Gait belt Activity Tolerance: Patient tolerated treatment well Patient left: in chair;with call bell/phone within reach;with family/visitor present Nurse Communication: Mobility status;Other (comment) (no need for chair alarm, pt is safe to ambulate independently in halls) PT Visit Diagnosis: Other abnormalities of gait and mobility (R26.89);Pain Pain - Right/Left: Right Pain - part of body: Leg (back, R leg)    Time: 1020-1046 PT Time Calculation (min) (ACUTE ONLY): 26 min   Charges:   PT Evaluation $PT Eval Low Complexity: 1 Low PT  Treatments $Gait Training: 8-22 mins        Raymond Gurney, PT, DPT Acute Rehabilitation Services  Pager: [phone] Office: [phone]   Jewel Baize 10/22/2020, 11:01 AM

## 2020-10-22 NOTE — Discharge Instructions (Signed)
Discharge Instructions ° °No restriction in activities, slowly increase your activity back to normal.  ° °Your incision is closed with dermabond (purple glue). This will naturally fall off over the next 1-2 weeks.  ° °Okay to shower on the day of discharge. Use regular soap and water and try to be gentle when cleaning your incision.  ° °Follow up with Dr. Korver Graybeal in 2 weeks after discharge. If you do not already have a discharge appointment, please call his office at [phone] to schedule a follow up appointment. If you have any concerns or questions, please call the office and let us know. °

## 2020-10-22 NOTE — Transfer of Care (Signed)
Immediate Anesthesia Transfer of Care Note  Patient: Cheryl [name]  Procedure(s) Performed: RIGHT Lumbar five - Sacral one MIS DISCECTOMY WITH MET-RX (Right Back)  Patient Location: PACU  Anesthesia Type:General  Level of Consciousness: awake, alert  and oriented  Airway & Oxygen Therapy: Patient Spontanous Breathing and Patient connected to nasal cannula oxygen  Post-op Assessment: Report given to RN, Post -op Vital signs reviewed and stable and Patient moving all extremities  Post vital signs: Reviewed and stable  Last Vitals:  Vitals Value Taken Time  BP 112/78 10/22/20 0005  Temp 36.9 C 10/22/20 0005  Pulse 79 10/22/20 0012  Resp 33 10/22/20 0012  SpO2 99 % 10/22/20 0012  Vitals shown include unvalidated device data.  Last Pain:  Vitals:   10/21/20 2141  TempSrc:   PainSc: 8          Complications: No complications documented.

## 2020-10-22 NOTE — Discharge Summary (Signed)
Discharge Summary  Date of Admission: 10/21/2020  Date of Discharge: 10/22/20  Attending Physician: Autumn Patty, MD  Hospital Course: Patient presented to the ED with worsening of her lumbar radicular pain with worsening foot weakness. She was taken to the OR late on the evening of 6/8 for a right L5-S1 MIS discectomy. She was recovered in PACU and transferred to 5N. Her hospital course was uncomplicated, her pain was improved immediately post-op, her foot weakness improved, and the patient was discharged home on 10/22/20. She will follow up in clinic with me in 2 weeks.  Neurologic exam at discharge:  AOx3, PERRL, EOMI, FS, TM Strength 5/5 x4, SILTx4  Discharge diagnosis: Lumbar radiculopathy  Jadene Pierini, MD 10/22/20 8:54 AM

## 2020-10-22 NOTE — Anesthesia Postprocedure Evaluation (Signed)
Anesthesia Post Note  Patient: Cheryl [name]  Procedure(s) Performed: RIGHT Lumbar five - Sacral one MIS DISCECTOMY WITH MET-RX (Right Back)     Patient location during evaluation: PACU Anesthesia Type: General Level of consciousness: awake and alert Pain management: pain level controlled Vital Signs Assessment: post-procedure vital signs reviewed and stable Respiratory status: spontaneous breathing, nonlabored ventilation and respiratory function stable Cardiovascular status: blood pressure returned to baseline and stable Postop Assessment: no apparent nausea or vomiting Anesthetic complications: no   No complications documented.  Last Vitals:  Vitals:   10/22/20 0015 10/22/20 0030  BP: 119/81 109/69  Pulse: 93 75  Resp: 15 16  Temp:    SpO2: 99% 99%    Last Pain:  Vitals:   10/22/20 0030  TempSrc:   PainSc: 0-No pain                 Ashyah Quizon,W. EDMOND

## 2020-10-22 NOTE — Progress Notes (Signed)
Neurosurgery Service Progress Note  Subjective: No acute events overnight, leg pain resolved immediately post-op, some right back/buttock pain but well controlled, no new complaints   Objective: Vitals:   10/22/20 0030 10/22/20 0045 10/22/20 0516 10/22/20 0756  BP: 109/69 108/70 101/80 105/60  Pulse: 75 70 63 95  Resp: 16 18 16 17   Temp:  98 F (36.7 C) 98.6 F (37 C) 98.3 F (36.8 C)  TempSrc:   Oral Oral  SpO2: 99% 98% 94% 95%    Physical Exam: AOx3, PERRL, EOMI, FS, TM, Strength 5/5 x4, SILTx4  Assessment & Plan: 40 y.o. woman s/p MIS discectomy for large 5-1 HNP, recovering well, foot weakness already resolved this morning.  -work w/ PT this morning -if she's doing well later this morning then discharge home  24  10/22/20 8:52 AM

## 2020-10-22 NOTE — Plan of Care (Signed)

## 2020-10-23 ENCOUNTER — Other Ambulatory Visit: Payer: Self-pay | Admitting: *Deleted

## 2020-10-23 ENCOUNTER — Encounter: Payer: Self-pay | Admitting: *Deleted

## 2020-10-23 NOTE — Patient Outreach (Signed)
[facility]) Care Management  10/23/2020  Cheryl [name] Apr 13, 1981 [id number]   Transition of care call/case closure   Referral received:10/23/20 Initial outreach:10/23/20 Insurance: [employer]   Subjective: Initial successful telephone call to patient's preferred number in order to complete transition of care assessment; 2 HIPAA identifiers verified. Explained purpose of call and completed transition of care assessment.  Cheryl [name] states she having a lot of pain, she reports taking prn medication as prescribed. Reinforced notifying surgeon for worsening/unrelieved pain. She states says surgical incisions are unremarkable. She reports tolerating taking a shower on today and changing positions hourly. 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 She says she is tolerating diet. She discussed  since surgery feeling as if not emptying bladder fully has to sit a while to start urinating, reinforced notifying surgeon sooner if not improved  .Spouse is assisting with her recovery.   Reviewed accessing the following [place] Benefits : She says she does does not have the hospital indemnity plan. She reports that she is made contact to access FMLA plan.   Objective:  Cheryl [name] was hospitalized at [facility] from 6/8-10/22/20  Right Lumbar 5-Sacral one MIS Discectomy Comorbidities include: Lumbar Radiculopathy, right lower extremity pain ans progressive weakness She was discharged to home on 10/22/20 without the need for home health services or DME.   Assessment:  Patient voices good understanding of all discharge instructions.  See transition of care flowsheet for assessment details.   Plan:  Reviewed hospital discharge diagnosis of Right L5-S1 Discectomy   and discharge treatment plan using hospital discharge instructions, assessing medication adherence, reviewing problems requiring provider notification,  and discussing the importance of follow up with surgeon, primary care provider and/or specialists as directed.  Reviewed [place] healthy lifestyle program information to receive discounted premium for  2023   Step 1: Get  your annual physical  Step 2: Complete your health assessment  Step 3:Identify your current health status and complete the corresponding action step between May 16, 2020 and January 14, 2021.    No ongoing care management needs identified so will close case to [facility] Care Management services and route successful outreach letter with [facility] Care Management pamphlet and 24 Hour Nurse Line Magnet to [employer] Care Management clinical pool to be mailed to patient's home address.  Thanked patient for their services to [facility].   Egbert Garibaldi, RN, BSN  [facility] Care Management,Care Management Coordinator  [phone]- Mobile [phone]- Toll Free Main Office

## 2021-03-22 ENCOUNTER — Ambulatory Visit (INDEPENDENT_AMBULATORY_CARE_PROVIDER_SITE_OTHER): Payer: No Typology Code available for payment source | Admitting: Family Medicine

## 2021-03-22 ENCOUNTER — Other Ambulatory Visit: Payer: Self-pay

## 2021-03-22 ENCOUNTER — Encounter: Payer: Self-pay | Admitting: Family Medicine

## 2021-03-22 VITALS — BP 117/63 | HR 68 | Ht 67.0 in | Wt 178.8 lb

## 2021-03-22 DIAGNOSIS — Z0000 Encounter for general adult medical examination without abnormal findings: Secondary | ICD-10-CM

## 2021-03-22 NOTE — Progress Notes (Signed)
Subjective:    Patient ID: Cheryl [name], female    DOB: December 18, 1980, 40 y.o.   MRN: [id number]  KHADIJATOU [name] is a 40 y.o. female presenting on 03/22/2021 for Annual Exam   HPI  Here for Annual Exam and Due for fasting lab soon.  Obesity BMI >28 Lifestyle Goals for improving dietary lifestyle, with improving both food choices, limiting carbs, and inc protein / vegetables, and calorie count as well. - Previously has tried some App based program and Physiological scientist but not recently. - Admits time constraints and schedule impacting her and limiting time. - Goal to work on metabolic program doTERRA - Goal to improve exercise in future.  S/p Lumbar microdiskectomy 10/21/20 Followed by Dr Zada Finders Improved now following spinal procedure   Health Maintenance:  UTD Flu Vaccine, through [place] Vaccine updated, eligible for new booster Omicron variant if interested.  Last pap smear 12/12/17 [facility], goal to contact them to schedule repeat, when indicated. Negative pap and negative HPV co-testing.  Due Mammogram age 58+ in 05/2021   Depression screen [facility] 2/9 03/22/2021 09/08/2020 12/05/2018  Decreased Interest 0 0 0  Down, Depressed, Hopeless 0 0 2  PHQ - 2 Score 0 0 2  Altered sleeping 0 2 0  Tired, decreased energy '1 2 1  ' Change in appetite 0 0 0  Feeling bad or failure about yourself  0 0 0  Trouble concentrating 0 0 1  Moving slowly or fidgety/restless 0 0 0  Suicidal thoughts 0 0 0  PHQ-9 Score '1 4 4  ' Difficult doing work/chores - Not difficult at all Somewhat difficult    Past Medical History:  Diagnosis Date   Bacterial pneumonia    age 28   Postpartum hemorrhage    G1   Urinary retention    after second delivery   Past Surgical History:  Procedure Laterality Date   LUMBAR LAMINECTOMY/ DECOMPRESSION WITH MET-RX Right 10/21/2020   Procedure: RIGHT Lumbar five - Sacral one MIS DISCECTOMY WITH MET-RX;  Surgeon: Judith Part, MD;  Location: [facility] OR;   Service: Neurosurgery;  Laterality: Right;   MANDIBLE SURGERY  1999   TONSILLECTOMY AND ADENOIDECTOMY     middle school age   79 EXTRACTION     middle school/high school - all four   Social History   Socioeconomic History   Marital status: Married    Spouse name: Not on file   Number of children: 2   Years of education: 19   Highest education level: Occupational hygienist History   Occupation: PHYSICAL THERAPIST    Comment: [employer]  Tobacco Use   Smoking status: Never   Smokeless tobacco: Never  Vaping Use   Vaping Use: Never used  Substance and Sexual Activity   Alcohol use: Yes    Comment: OCC   Drug use: Never   Sexual activity: Yes    Birth control/protection: I.U.D.    Comment: Mirena  Other Topics Concern   Not on file  Social History Narrative   Not on file   Social Determinants of Health   Financial Resource Strain: Not on file  Food Insecurity: Not on file  Transportation Needs: Not on file  Physical Activity: Not on file  Stress: Not on file  Social Connections: Not on file  Intimate Partner Violence: Not on file   Family History  Problem Relation Age of Onset   Diabetes Mother        type 2  and GDM   Hypertension Mother    Deep vein thrombosis Mother    Hypertension Father    Mental illness Brother    Diabetes Maternal Grandmother    Heart disease Maternal Grandmother    Alzheimer's disease Paternal Grandfather    Healthy Daughter    Healthy Daughter    Breast cancer Neg Hx    Current Outpatient Medications on File Prior to Visit  Medication Sig   levonorgestrel (MIRENA) 20 MCG/24HR IUD 1 each by Intrauterine route once.   No current facility-administered medications on file prior to visit.    Review of Systems  Constitutional:  Negative for activity change, appetite change, chills, diaphoresis, fatigue and fever.  HENT:  Negative for congestion and hearing loss.   Eyes:  Negative for visual disturbance.   Respiratory:  Negative for cough, chest tightness, shortness of breath and wheezing.   Cardiovascular:  Negative for chest pain, palpitations and leg swelling.  Gastrointestinal:  Negative for abdominal pain, constipation, diarrhea, nausea and vomiting.  Genitourinary:  Negative for dysuria, frequency and hematuria.  Musculoskeletal:  Negative for arthralgias and neck pain.  Skin:  Negative for rash.  Neurological:  Negative for dizziness, weakness, light-headedness, numbness and headaches.  Hematological:  Negative for adenopathy.  Psychiatric/Behavioral:  Negative for behavioral problems, dysphoric mood and sleep disturbance.   Per HPI unless specifically indicated above      Objective:    BP 117/63   Pulse 68   Ht '5\' 7"'  (1.702 m)   Wt 178 lb 12.8 oz (81.1 kg)   SpO2 100%   BMI 28.00 kg/m   Wt Readings from Last 3 Encounters:  03/22/21 178 lb 12.8 oz (81.1 kg)  09/08/20 172 lb (78 kg)  06/24/20 175 lb (79.4 kg)    Physical Exam Vitals and nursing note reviewed.  Constitutional:      General: She is not in acute distress.    Appearance: She is well-developed. She is not diaphoretic.     Comments: Well-appearing, comfortable, cooperative  HENT:     Head: Normocephalic and atraumatic.  Eyes:     General:        Right eye: No discharge.        Left eye: No discharge.     Conjunctiva/sclera: Conjunctivae normal.     Pupils: Pupils are equal, round, and reactive to light.  Neck:     Thyroid: No thyromegaly.  Cardiovascular:     Rate and Rhythm: Normal rate and regular rhythm.     Pulses: Normal pulses.     Heart sounds: Normal heart sounds. No murmur heard. Pulmonary:     Effort: Pulmonary effort is normal. No respiratory distress.     Breath sounds: Normal breath sounds. No wheezing or rales.  Abdominal:     General: Bowel sounds are normal. There is no distension.     Palpations: Abdomen is soft. There is no mass.     Tenderness: There is no abdominal tenderness.   Musculoskeletal:        General: No tenderness. Normal range of motion.     Cervical back: Normal range of motion and neck supple.     Comments: Upper / Lower Extremities: - Normal muscle tone, strength bilateral upper extremities 5/5, lower extremities 5/5  Lymphadenopathy:     Cervical: No cervical adenopathy.  Skin:    General: Skin is warm and dry.     Findings: No erythema or rash.  Neurological:     Mental Status: She  is alert and oriented to person, place, and time.     Comments: Distal sensation intact to light touch all extremities  Psychiatric:        Mood and Affect: Mood normal.        Behavior: Behavior normal.        Thought Content: Thought content normal.     Comments: Well groomed, good eye contact, normal speech and thoughts     Results for orders placed or performed during the hospital encounter of 10/21/20  Resp Panel by RT-PCR (Flu A&B, Covid) Nasopharyngeal Swab   Specimen: Nasopharyngeal Swab; Nasopharyngeal(NP) swabs in vial transport medium  Result Value Ref Range   SARS Coronavirus 2 by RT PCR NEGATIVE NEGATIVE   Influenza A by PCR NEGATIVE NEGATIVE   Influenza B by PCR NEGATIVE NEGATIVE  CBC with Differential  Result Value Ref Range   WBC 12.1 (H) 4.0 - 10.5 K/uL   RBC 4.91 3.87 - 5.11 MIL/uL   Hemoglobin 14.4 12.0 - 15.0 g/dL   HCT 44.8 36.0 - 46.0 %   MCV 91.2 80.0 - 100.0 fL   MCH 29.3 26.0 - 34.0 pg   MCHC 32.1 30.0 - 36.0 g/dL   RDW 13.0 11.5 - 15.5 %   Platelets 236 150 - 400 K/uL   nRBC 0.0 0.0 - 0.2 %   Neutrophils Relative % 60 %   Neutro Abs 7.3 1.7 - 7.7 K/uL   Lymphocytes Relative 32 %   Lymphs Abs 3.9 0.7 - 4.0 K/uL   Monocytes Relative 5 %   Monocytes Absolute 0.7 0.1 - 1.0 K/uL   Eosinophils Relative 1 %   Eosinophils Absolute 0.1 0.0 - 0.5 K/uL   Basophils Relative 1 %   Basophils Absolute 0.1 0.0 - 0.1 K/uL   Immature Granulocytes 1 %   Abs Immature Granulocytes 0.10 (H) 0.00 - 0.07 K/uL  Basic metabolic panel  Result  Value Ref Range   Sodium 138 135 - 145 mmol/L   Potassium 3.3 (L) 3.5 - 5.1 mmol/L   Chloride 103 98 - 111 mmol/L   CO2 24 22 - 32 mmol/L   Glucose, Bld 88 70 - 99 mg/dL   BUN 12 6 - 20 mg/dL   Creatinine, Ser 0.96 0.44 - 1.00 mg/dL   Calcium 9.1 8.9 - 10.3 mg/dL   GFR, Estimated >60 >60 mL/min   Anion gap 11 5 - 15  I-Stat Beta hCG blood, ED ([facility], [facility], [facility] only)  Result Value Ref Range   I-stat hCG, quantitative <5.0 <5 mIU/mL   Comment 3              Assessment & Plan:   Problem List Items Addressed This Visit   None Visit Diagnoses     Annual physical exam    -  Primary   Relevant Orders   COMPLETE METABOLIC PANEL WITH GFR   Lipid panel   CBC with Differential/Platelet       Updated Health Maintenance information Vaccines reviewed. Will consider COVID Booster [facility] for next pap smear when indicated, last 2019, good for 3-5 years. Reviewed recent lab results with patient Encouraged improvement to lifestyle with diet and exercise Goal of weight loss  Orders Placed This Encounter  Procedures   COMPLETE METABOLIC PANEL WITH GFR    Standing Status:   Future    Standing Expiration Date:   05/15/2021   Lipid panel    Standing Status:   Future    Standing Expiration  Date:   05/15/2021    Order Specific Question:   Has the patient fasted?    Answer:   Yes   CBC with Differential/Platelet    Standing Status:   Future    Standing Expiration Date:   05/15/2021      No orders of the defined types were placed in this encounter.     Follow up plan: Return in about 2 days (around 03/24/2021) for 2 days fasting lab only Weds 11/9 at 8am - then 1 year fasting lab then 1 wk later Annual.  CMET, CBC, Lipid - HOLD HIV, Hep C, A1c  Nobie Putnam, DO [facility] 03/22/2021, 1:42 PM

## 2021-03-22 NOTE — Patient Instructions (Addendum)
Thank you for coming to the office today.  Recommend calling [facility] to check in with them and find out when to do Pap Smear next, last was 2019, should be 3-5 years, with negative test and negative HPV cotesting.  Breast cancer screening starts at age 39+ , contact us if interested to pursue next year.   DUE for FASTING BLOOD WORK (no food or drink after midnight before the lab appointment, only water or coffee without cream/sugar on the morning of)  SCHEDULE "Lab Only" visit in the morning at the clinic for lab draw in 1 YEAR  - Make sure Lab Only appointment is at about 1 week before your next appointment, so that results will be available  For Lab Results, once available within 2-3 days of blood draw, you can can log in to MyChart online to view your results and a brief explanation. Also, we can discuss results at next follow-up visit.   Please schedule a Follow-up Appointment to: Return in about 2 days (around 03/24/2021) for 2 days fasting lab only Weds 11/9 at 8am.  If you have any other questions or concerns, please feel free to call the office or send a message through MyChart. You may also schedule an earlier appointment if necessary.  Additionally, you may be receiving a survey about your experience at our office within a few days to 1 week by e-mail or mail. We value your feedback.  Saralyn Pilar, DO [facility], [place]

## 2021-03-23 ENCOUNTER — Other Ambulatory Visit: Payer: Self-pay

## 2021-03-23 DIAGNOSIS — Z0000 Encounter for general adult medical examination without abnormal findings: Secondary | ICD-10-CM

## 2021-03-24 ENCOUNTER — Other Ambulatory Visit: Payer: No Typology Code available for payment source

## 2021-03-24 ENCOUNTER — Other Ambulatory Visit: Payer: Self-pay

## 2021-03-25 LAB — LIPID PANEL
Cholesterol: 170 mg/dL (ref <200)
HDL: 66 mg/dL (ref >=50)
LDL Cholesterol (Calc): 84 mg/dL (calc)
Non-HDL Cholesterol (Calc): 104 mg/dL (calc) (ref <130)
Total CHOL/HDL Ratio: 2.6 (calc) (ref <5.0)
Triglycerides: 107 mg/dL (ref <150)

## 2021-03-25 LAB — COMPLETE METABOLIC PANEL WITH GFR
AG Ratio: 2 (calc) (ref 1.0–2.5)
ALT: 10 U/L (ref 6–29)
AST: 11 U/L (ref 10–30)
Albumin: 4.3 g/dL (ref 3.6–5.1)
Alkaline phosphatase (APISO): 49 U/L (ref 31–125)
BUN: 10 mg/dL (ref 7–25)
CO2: 23 mmol/L (ref 20–32)
Calcium: 8.9 mg/dL (ref 8.6–10.2)
Chloride: 106 mmol/L (ref 98–110)
Creat: 0.76 mg/dL (ref 0.50–0.97)
Globulin: 2.1 g/dL (calc) (ref 1.9–3.7)
Glucose, Bld: 85 mg/dL (ref 65–99)
Potassium: 4.1 mmol/L (ref 3.5–5.3)
Sodium: 141 mmol/L (ref 135–146)
Total Bilirubin: 0.6 mg/dL (ref 0.2–1.2)
Total Protein: 6.4 g/dL (ref 6.1–8.1)
eGFR: 102 mL/min/{1.73_m2} (ref >=60)

## 2021-03-25 LAB — CBC WITH DIFFERENTIAL/PLATELET
Absolute Monocytes: 429 cells/uL (ref 200–950)
Basophils Absolute: 38 cells/uL (ref 0–200)
Basophils Relative: 0.6 %
Eosinophils Absolute: 70 cells/uL (ref 15–500)
Eosinophils Relative: 1.1 %
HCT: 39.9 % (ref 35.0–45.0)
Hemoglobin: 13.2 g/dL (ref 11.7–15.5)
Lymphs Abs: 2138 cells/uL (ref 850–3900)
MCH: 29.5 pg (ref 27.0–33.0)
MCHC: 33.1 g/dL (ref 32.0–36.0)
MCV: 89.3 fL (ref 80.0–100.0)
MPV: 9.4 fL (ref 7.5–12.5)
Monocytes Relative: 6.7 %
Neutro Abs: 3725 cells/uL (ref 1500–7800)
Neutrophils Relative %: 58.2 %
Platelets: 200 10*3/uL (ref 140–400)
RBC: 4.47 10*6/uL (ref 3.80–5.10)
RDW: 12.4 % (ref 11.0–15.0)
Total Lymphocyte: 33.4 %
WBC: 6.4 10*3/uL (ref 3.8–10.8)

## 2021-04-20 NOTE — Telephone Encounter (Signed)
Mirena rcvd/charged 06/15/2018

## 2021-08-09 IMAGING — DX DG LUMBAR SPINE COMPLETE 4+V
5 series · 5 of 5 positions shown · non-contrast
Comparison: None.

CLINICAL DATA: Low back pain since lifting injury on 03/27/2019.

EXAM:
LUMBAR SPINE - COMPLETE 4+ VIEW

[l-spine ap]
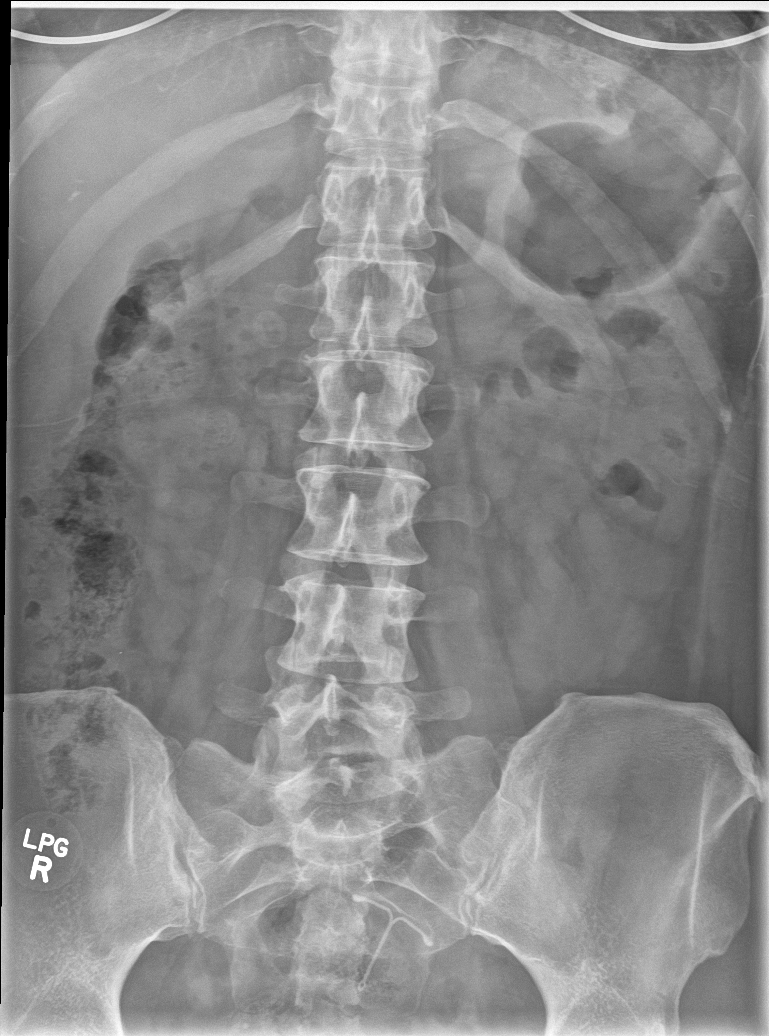

[l-spine obl (1 of 2)]
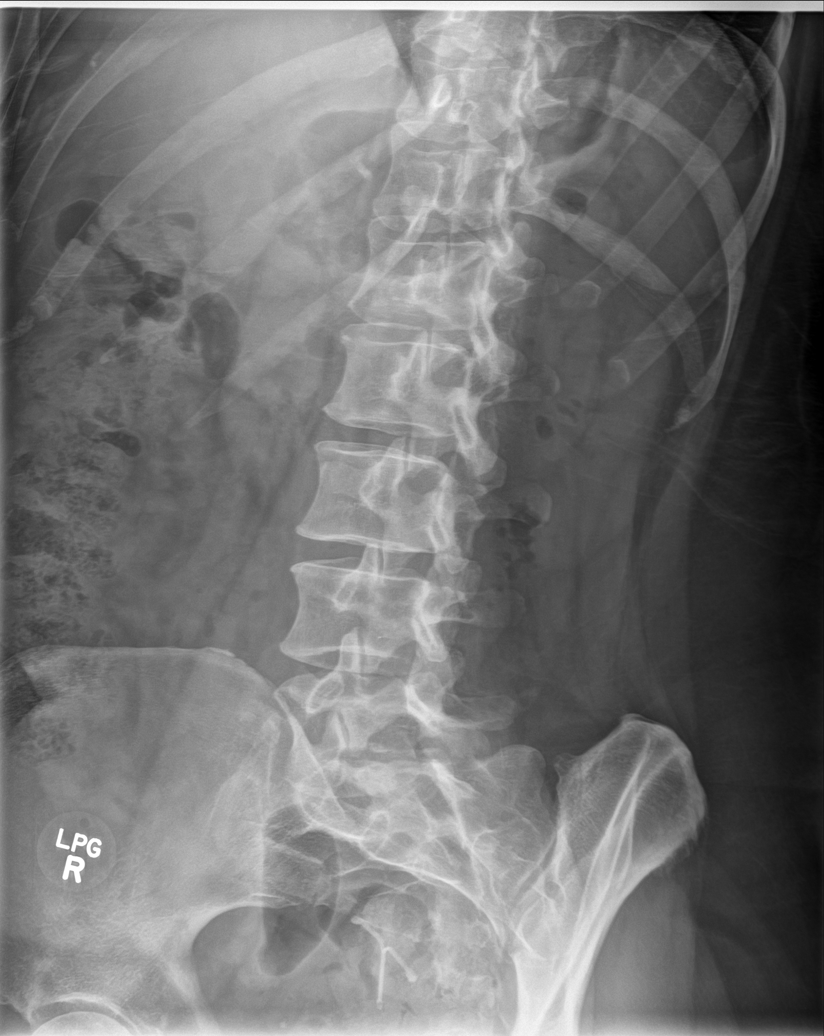

[l-spine obl (2 of 2)]
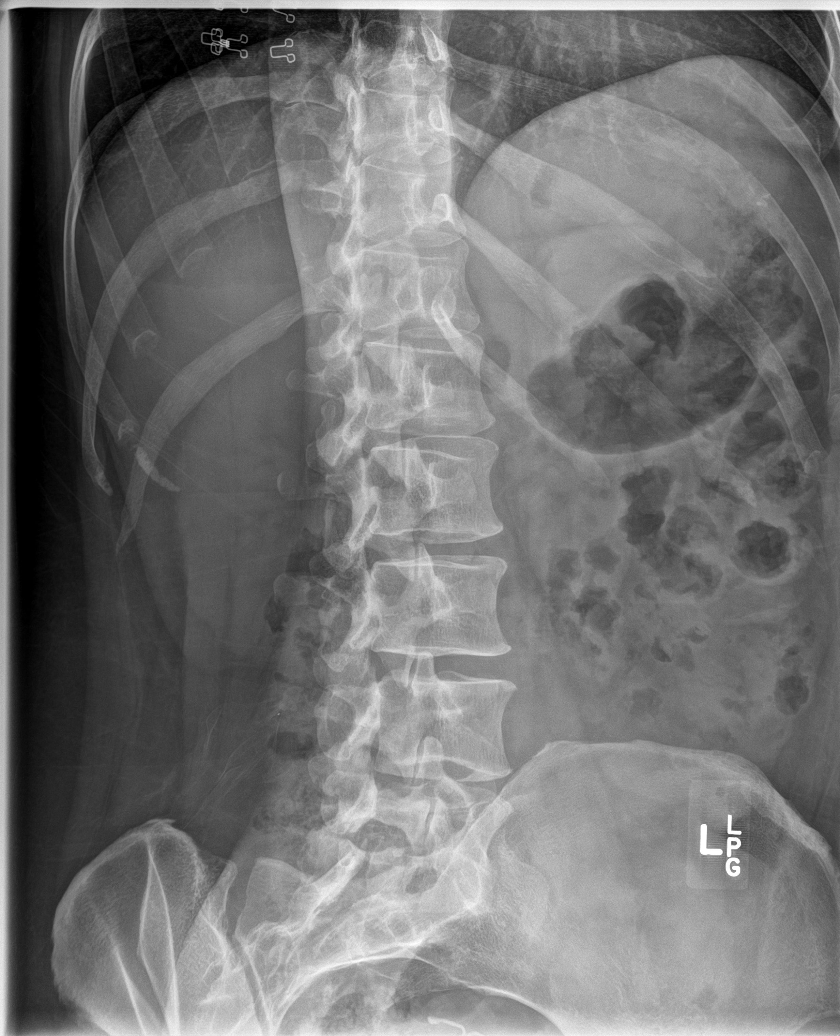

[l-spine lat]
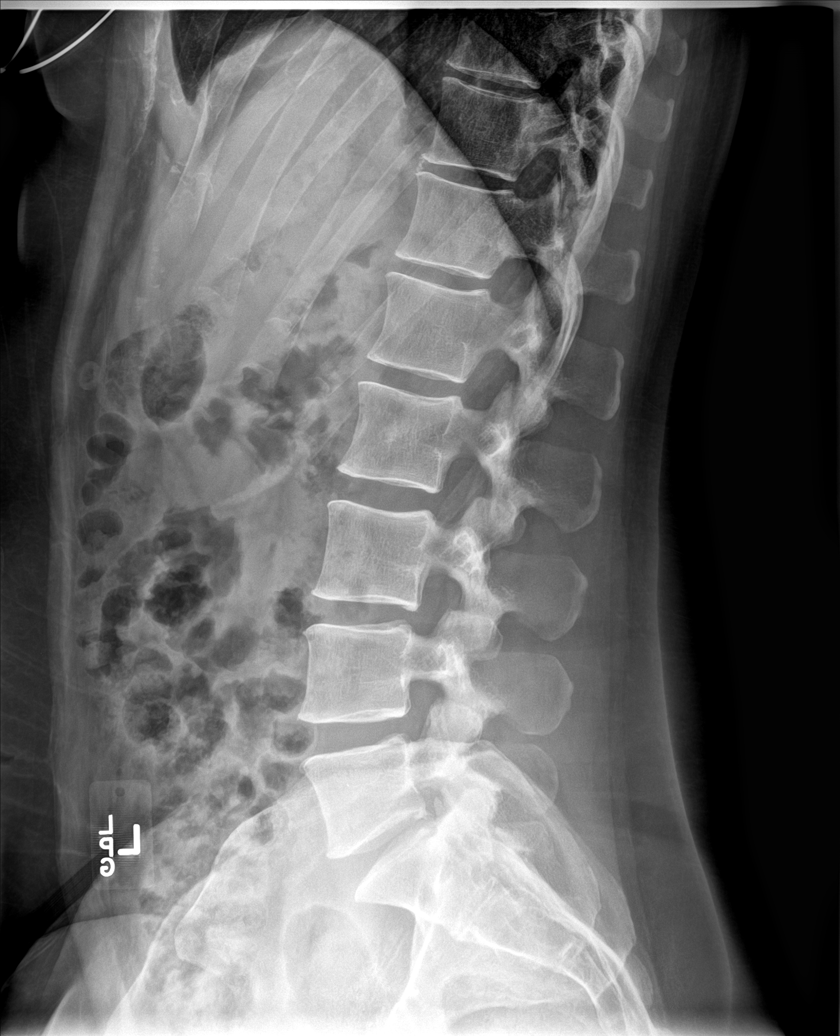

[l-spine spot]
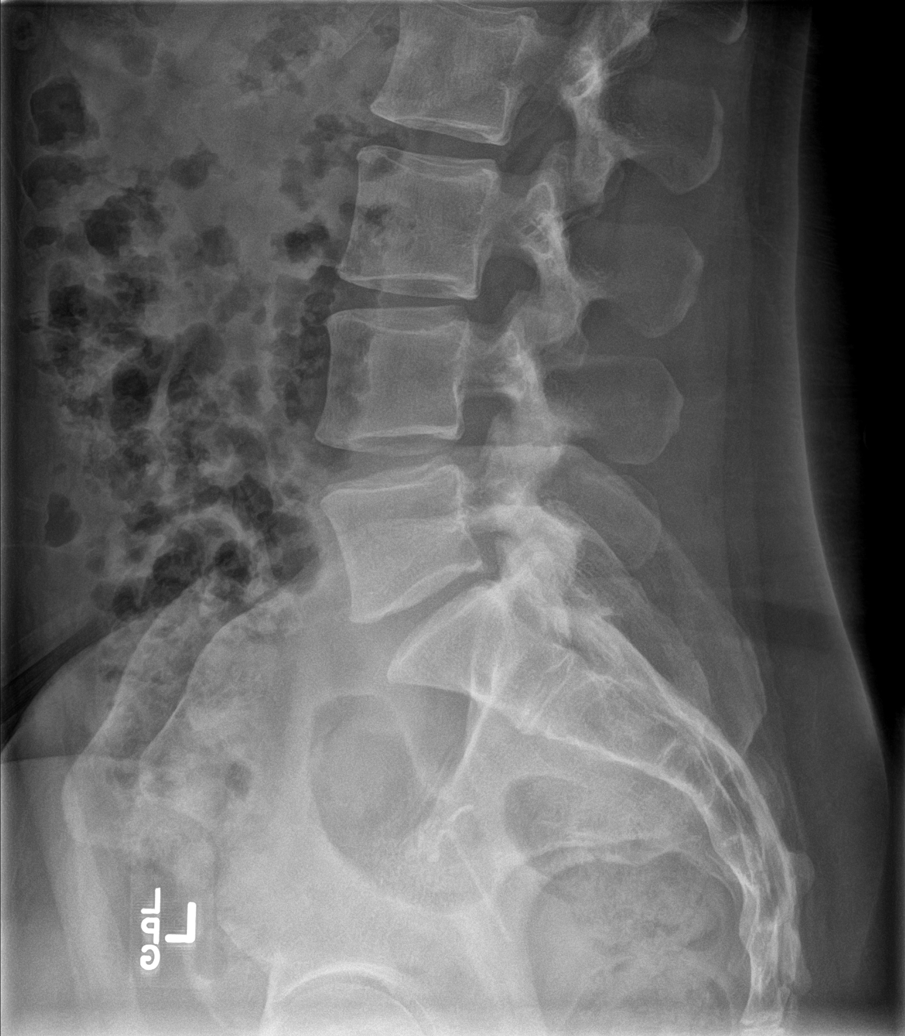

[5 of 5 positions shown; findings below may reference images not displayed]

FINDINGS: This report assumes 5 non rib-bearing lumbar vertebrae.

Lumbar vertebral body heights are preserved, with no fracture.

Minimal loss of disc height at L3-4 without significant spondylosis.
Otherwise preserved lumbar disc spaces. No spondylolisthesis. No
appreciable facet arthropathy. No aggressive appearing focal osseous
lesions.
IMPRESSION: Minimal loss of disc height at L3-4. Otherwise normal lumbar spine
radiographs.

## 2022-02-17 ENCOUNTER — Telehealth: Payer: Self-pay

## 2022-02-17 ENCOUNTER — Other Ambulatory Visit: Payer: Self-pay | Admitting: Family Medicine

## 2022-02-17 DIAGNOSIS — E7800 Pure hypercholesterolemia, unspecified: Secondary | ICD-10-CM

## 2022-02-17 DIAGNOSIS — Z0000 Encounter for general adult medical examination without abnormal findings: Secondary | ICD-10-CM

## 2022-02-17 DIAGNOSIS — Z131 Encounter for screening for diabetes mellitus: Secondary | ICD-10-CM

## 2022-02-17 NOTE — Telephone Encounter (Signed)
Copied from [place] [id number]. Topic: Appointment Scheduling - Scheduling Inquiry for Clinic >> Feb 17, 2022  8:23 AM Devoria Glassing wrote: Reason for CRM: pt has cpe on 03/30/2022 at 2:30 in the afternoon.  Would like to know if Dr Raliegh Ip will put orders in for her to get her cpe labs done a week prior.

## 2022-02-17 NOTE — Telephone Encounter (Signed)
Sure.  I have placed lab orders fasting for 1 week prior to 03/30/22  She should probably call to schedule the actual Lab Apt so she has it arranged in advance.  Nobie Putnam, [facility] 02/17/2022, 1:53 PM

## 2022-02-24 NOTE — Telephone Encounter (Signed)
I left her a message letting her know that she can call back and make a lab appt for the week of 11/6.

## 2022-03-18 ENCOUNTER — Other Ambulatory Visit: Payer: Self-pay

## 2022-03-18 DIAGNOSIS — Z0000 Encounter for general adult medical examination without abnormal findings: Secondary | ICD-10-CM

## 2022-03-18 DIAGNOSIS — E7800 Pure hypercholesterolemia, unspecified: Secondary | ICD-10-CM

## 2022-03-18 DIAGNOSIS — Z131 Encounter for screening for diabetes mellitus: Secondary | ICD-10-CM

## 2022-03-21 ENCOUNTER — Other Ambulatory Visit: Payer: BC Managed Care – PPO

## 2022-03-21 DIAGNOSIS — E7800 Pure hypercholesterolemia, unspecified: Secondary | ICD-10-CM | POA: Diagnosis not present

## 2022-03-21 DIAGNOSIS — Z0000 Encounter for general adult medical examination without abnormal findings: Secondary | ICD-10-CM | POA: Diagnosis not present

## 2022-03-21 DIAGNOSIS — Z131 Encounter for screening for diabetes mellitus: Secondary | ICD-10-CM | POA: Diagnosis not present

## 2022-03-22 LAB — CBC WITH DIFFERENTIAL/PLATELET
Absolute Monocytes: 347 cells/uL (ref 200–950)
Basophils Absolute: 50 cells/uL (ref 0–200)
Basophils Relative: 0.8 %
Eosinophils Absolute: 68 cells/uL (ref 15–500)
Eosinophils Relative: 1.1 %
HCT: 39.3 % (ref 35.0–45.0)
Hemoglobin: 13 g/dL (ref 11.7–15.5)
Lymphs Abs: 1662 cells/uL (ref 850–3900)
MCH: 29.5 pg (ref 27.0–33.0)
MCHC: 33.1 g/dL (ref 32.0–36.0)
MCV: 89.1 fL (ref 80.0–100.0)
MPV: 10 fL (ref 7.5–12.5)
Monocytes Relative: 5.6 %
Neutro Abs: 4073 cells/uL (ref 1500–7800)
Neutrophils Relative %: 65.7 %
Platelets: 179 10*3/uL (ref 140–400)
RBC: 4.41 10*6/uL (ref 3.80–5.10)
RDW: 12.4 % (ref 11.0–15.0)
Total Lymphocyte: 26.8 %
WBC: 6.2 10*3/uL (ref 3.8–10.8)

## 2022-03-22 LAB — TSH: TSH: 4.36 mIU/L

## 2022-03-22 LAB — COMPLETE METABOLIC PANEL WITH GFR
AG Ratio: 2.2 (calc) (ref 1.0–2.5)
ALT: 13 U/L (ref 6–29)
AST: 15 U/L (ref 10–30)
Albumin: 4.4 g/dL (ref 3.6–5.1)
Alkaline phosphatase (APISO): 36 U/L (ref 31–125)
BUN: 14 mg/dL (ref 7–25)
CO2: 25 mmol/L (ref 20–32)
Calcium: 9 mg/dL (ref 8.6–10.2)
Chloride: 107 mmol/L (ref 98–110)
Creat: 0.73 mg/dL (ref 0.50–0.99)
Globulin: 2 g/dL (calc) (ref 1.9–3.7)
Glucose, Bld: 88 mg/dL (ref 65–99)
Potassium: 3.9 mmol/L (ref 3.5–5.3)
Sodium: 140 mmol/L (ref 135–146)
Total Bilirubin: 0.5 mg/dL (ref 0.2–1.2)
Total Protein: 6.4 g/dL (ref 6.1–8.1)
eGFR: 107 mL/min/{1.73_m2} (ref >=60)

## 2022-03-22 LAB — LIPID PANEL
Cholesterol: 158 mg/dL (ref <200)
HDL: 76 mg/dL (ref >=50)
LDL Cholesterol (Calc): 69 mg/dL (calc)
Non-HDL Cholesterol (Calc): 82 mg/dL (calc) (ref <130)
Total CHOL/HDL Ratio: 2.1 (calc) (ref <5.0)
Triglycerides: 54 mg/dL (ref <150)

## 2022-03-22 LAB — HEMOGLOBIN A1C
Hgb A1c MFr Bld: 5.5 % of total Hgb (ref <5.7)
Mean Plasma Glucose: 111 mg/dL
eAG (mmol/L): 6.2 mmol/L

## 2022-03-30 ENCOUNTER — Ambulatory Visit (INDEPENDENT_AMBULATORY_CARE_PROVIDER_SITE_OTHER): Payer: BC Managed Care – PPO | Admitting: Family Medicine

## 2022-03-30 ENCOUNTER — Other Ambulatory Visit: Payer: Self-pay | Admitting: Family Medicine

## 2022-03-30 ENCOUNTER — Encounter: Payer: Self-pay | Admitting: Family Medicine

## 2022-03-30 VITALS — BP 101/61 | HR 69 | Ht 67.0 in | Wt 138.2 lb

## 2022-03-30 DIAGNOSIS — Z0000 Encounter for general adult medical examination without abnormal findings: Secondary | ICD-10-CM

## 2022-03-30 DIAGNOSIS — Z1231 Encounter for screening mammogram for malignant neoplasm of breast: Secondary | ICD-10-CM

## 2022-03-30 DIAGNOSIS — E7800 Pure hypercholesterolemia, unspecified: Secondary | ICD-10-CM

## 2022-03-30 DIAGNOSIS — Z23 Encounter for immunization: Secondary | ICD-10-CM

## 2022-03-30 DIAGNOSIS — R7309 Other abnormal glucose: Secondary | ICD-10-CM

## 2022-03-30 NOTE — Progress Notes (Signed)
Subjective:    Patient ID: Cheryl [name], female    DOB: Jul 08, 1980, 41 y.o.   MRN: [id number]  Cheryl [name] is a 41 y.o. female presenting on 03/30/2022 for Annual Exam   HPI  Here for Annual Exam and Due for fasting lab soon.  BMI 21 / Lifestyle  Interval update since last year  Wt loss 40 lbs, in past 6 months, started Jan and at goal in June 2023, she did [employer], and eating less and overall doing well with balanced, has maintained her weight and doing well.  S/p Lumbar microdiskectomy 10/21/20 Followed by Dr Zada Finders Improved now following spinal procedure No further issues  Followed by [facility] Dr Florinda Marker, now once yearly screening, multiple moles   History of Bladder issues with overdistended bladder when had her daughter, on flomax short term, occasional issue with difficulty voiding at start but no other routine problems.     Health Maintenance:  UTD Vaccines.  Due Tdap today   Last pap smear 12/12/17 [facility], goal to contact them to schedule repeat, when indicated. Negative pap and negative HPV co-testing. - Next due Pap Smear 2024.   Due Mammogram age 65+, initial screening, ordered to [facility], she can schedule.       03/30/2022    2:48 PM 03/22/2021    1:37 PM 09/08/2020    4:12 PM  Depression screen PHQ 2/9  Decreased Interest 0 0 0  Down, Depressed, Hopeless 0 0 0  PHQ - 2 Score 0 0 0  Altered sleeping 0 0 2  Tired, decreased energy 0 1 2  Change in appetite 0 0 0  Feeling bad or failure about yourself  0 0 0  Trouble concentrating 0 0 0  Moving slowly or fidgety/restless 0 0 0  Suicidal thoughts 0 0 0  PHQ-9 Score 0 1 4  Difficult doing work/chores Not difficult at all  Not difficult at all    Past Medical History:  Diagnosis Date   Bacterial pneumonia    age 22   Postpartum hemorrhage    G1   Urinary retention    after second delivery   Past Surgical History:  Procedure Laterality Date   LUMBAR  LAMINECTOMY/ DECOMPRESSION WITH MET-RX Right 10/21/2020   Procedure: RIGHT Lumbar five - Sacral one MIS DISCECTOMY WITH MET-RX;  Surgeon: Judith Part, MD;  Location: [facility];  Service: Neurosurgery;  Laterality: Right;   MANDIBLE SURGERY  1999   TONSILLECTOMY AND ADENOIDECTOMY     middle school age   46 EXTRACTION     middle school/high school - all four   Social History   Socioeconomic History   Marital status: Married    Spouse name: Not on file   Number of children: 2   Years of education: 19   Highest education level: Occupational hygienist History   Occupation: PHYSICAL THERAPIST    Comment: [employer]  Tobacco Use   Smoking status: Never   Smokeless tobacco: Never  Vaping Use   Vaping Use: Never used  Substance and Sexual Activity   Alcohol use: Yes    Comment: OCC   Drug use: Never   Sexual activity: Yes    Birth control/protection: I.U.D.    Comment: Mirena  Other Topics Concern   Not on file  Social History Narrative   Not on file   Social Determinants of Health   Financial Resource Strain: Not on file  Food Insecurity: Not  on file  Transportation Needs: Not on file  Physical Activity: Not on file  Stress: Not on file  Social Connections: Socially Integrated (04/24/2018)   Social Connection and Isolation Panel [NHANES]    Frequency of Communication with Friends and Family: More than three times a week    Frequency of Social Gatherings with Friends and Family: More than three times a week    Attends Religious Services: More than 4 times per year    Active Member of [employer] or Organizations: Yes    Attends Music therapist: More than 4 times per year    Marital Status: Married  Human resources officer Violence: Not At Risk (04/24/2018)   Humiliation, Afraid, Rape, and Kick questionnaire    Fear of Current or Ex-Partner: No    Emotionally Abused: No    Physically Abused: No    Sexually Abused: No   Family History  Problem  Relation Age of Onset   Diabetes Mother        type 2 and GDM   Hypertension Mother    Deep vein thrombosis Mother    Hypertension Father    Mental illness Brother    Diabetes Maternal Grandmother    Heart disease Maternal Grandmother    Alzheimer's disease Paternal Grandfather    Healthy Daughter    Healthy Daughter    Breast cancer Neg Hx    Current Outpatient Medications on File Prior to Visit  Medication Sig   levonorgestrel (MIRENA) 20 MCG/24HR IUD 1 each by Intrauterine route once.   No current facility-administered medications on file prior to visit.    Review of Systems  Constitutional:  Negative for activity change, appetite change, chills, diaphoresis, fatigue and fever.  HENT:  Negative for congestion and hearing loss.   Eyes:  Negative for visual disturbance.  Respiratory:  Negative for cough, chest tightness, shortness of breath and wheezing.   Cardiovascular:  Negative for chest pain, palpitations and leg swelling.  Gastrointestinal:  Negative for abdominal pain, constipation, diarrhea, nausea and vomiting.  Genitourinary:  Negative for dysuria, frequency and hematuria.  Musculoskeletal:  Negative for arthralgias and neck pain.  Skin:  Negative for rash.  Neurological:  Negative for dizziness, weakness, light-headedness, numbness and headaches.  Hematological:  Negative for adenopathy.  Psychiatric/Behavioral:  Negative for behavioral problems, dysphoric mood and sleep disturbance.    Per HPI unless specifically indicated above      Objective:    BP 101/61   Pulse 69   Ht _0  (1.702 m)   Wt 138 lb 3.2 oz (62.7 kg)   SpO2 100%   BMI 21.65 kg/m   Wt Readings from Last 3 Encounters:  03/30/22 138 lb 3.2 oz (62.7 kg)  03/22/21 178 lb 12.8 oz (81.1 kg)  09/08/20 172 lb (78 kg)    Physical Exam Vitals and nursing note reviewed.  Constitutional:      General: She is not in acute distress.    Appearance: She is well-developed. She is not diaphoretic.      Comments: Well-appearing, comfortable, cooperative  HENT:     Head: Normocephalic and atraumatic.  Eyes:     General:        Right eye: No discharge.        Left eye: No discharge.     Conjunctiva/sclera: Conjunctivae normal.     Pupils: Pupils are equal, round, and reactive to light.  Neck:     Thyroid: No thyromegaly.  Cardiovascular:     Rate and  Rhythm: Normal rate and regular rhythm.     Pulses: Normal pulses.     Heart sounds: Normal heart sounds. No murmur heard. Pulmonary:     Effort: Pulmonary effort is normal. No respiratory distress.     Breath sounds: Normal breath sounds. No wheezing or rales.  Abdominal:     General: Bowel sounds are normal. There is no distension.     Palpations: Abdomen is soft. There is no mass.     Tenderness: There is no abdominal tenderness.  Musculoskeletal:        General: No tenderness. Normal range of motion.     Cervical back: Normal range of motion and neck supple.     Comments: Upper / Lower Extremities: - Normal muscle tone, strength bilateral upper extremities 5/5, lower extremities 5/5  Lymphadenopathy:     Cervical: No cervical adenopathy.  Skin:    General: Skin is warm and dry.     Findings: No erythema or rash.  Neurological:     Mental Status: She is alert and oriented to person, place, and time.     Comments: Distal sensation intact to light touch all extremities  Psychiatric:        Mood and Affect: Mood normal.        Behavior: Behavior normal.        Thought Content: Thought content normal.     Comments: Well groomed, good eye contact, normal speech and thoughts       Results for orders placed or performed in visit on 03/18/22  Hemoglobin A1c  Result Value Ref Range   Hgb A1c MFr Bld 5.5 <5.7 % of total Hgb   Mean Plasma Glucose 111 mg/dL   eAG (mmol/L) 6.2 mmol/L  TSH  Result Value Ref Range   TSH 4.36 mIU/L  CBC with Differential/Platelet  Result Value Ref Range   WBC 6.2 3.8 - 10.8 Thousand/uL    RBC 4.41 3.80 - 5.10 Million/uL   Hemoglobin 13.0 11.7 - 15.5 g/dL   HCT 39.3 35.0 - 45.0 %   MCV 89.1 80.0 - 100.0 fL   MCH 29.5 27.0 - 33.0 pg   MCHC 33.1 32.0 - 36.0 g/dL   RDW 12.4 11.0 - 15.0 %   Platelets 179 140 - 400 Thousand/uL   MPV 10.0 7.5 - 12.5 fL   Neutro Abs 4,073 1,500 - 7,800 cells/uL   Lymphs Abs 1,662 850 - 3,900 cells/uL   Absolute Monocytes 347 200 - 950 cells/uL   Eosinophils Absolute 68 15 - 500 cells/uL   Basophils Absolute 50 0 - 200 cells/uL   Neutrophils Relative % 65.7 %   Total Lymphocyte 26.8 %   Monocytes Relative 5.6 %   Eosinophils Relative 1.1 %   Basophils Relative 0.8 %  Lipid panel  Result Value Ref Range   Cholesterol 158 <200 mg/dL   HDL 76 > OR = 50 mg/dL   Triglycerides 54 <150 mg/dL   LDL Cholesterol (Calc) 69 mg/dL (calc)   Total CHOL/HDL Ratio 2.1 <5.0 (calc)   Non-HDL Cholesterol (Calc) 82 <130 mg/dL (calc)  COMPLETE METABOLIC PANEL WITH GFR  Result Value Ref Range   Glucose, Bld 88 65 - 99 mg/dL   BUN 14 7 - 25 mg/dL   Creat 0.73 0.50 - 0.99 mg/dL   eGFR 107 > OR = 60 mL/min/1.41m   BUN/Creatinine Ratio SEE NOTE: 6 - 22 (calc)   Sodium 140 135 - 146 mmol/L   Potassium 3.9 3.5 - 5.3  mmol/L   Chloride 107 98 - 110 mmol/L   CO2 25 20 - 32 mmol/L   Calcium 9.0 8.6 - 10.2 mg/dL   Total Protein 6.4 6.1 - 8.1 g/dL   Albumin 4.4 3.6 - 5.1 g/dL   Globulin 2.0 1.9 - 3.7 g/dL (calc)   AG Ratio 2.2 1.0 - 2.5 (calc)   Total Bilirubin 0.5 0.2 - 1.2 mg/dL   Alkaline phosphatase (APISO) 36 31 - 125 U/L   AST 15 10 - 30 U/L   ALT 13 6 - 29 U/L      Assessment & Plan:   Problem List Items Addressed This Visit   None Visit Diagnoses     Annual physical exam    -  Primary   Need for Tdap vaccination       Relevant Orders   Tdap vaccine greater than or equal to 7yo IM (Completed)   Encounter for screening mammogram for malignant neoplasm of breast       Relevant Orders   MM 3D SCREEN BREAST BILATERAL       Updated Health  Maintenance information Reviewed recent lab results with patient Encouraged improvement to lifestyle with diet and exercise  Excellent weight loss with [employer] diet lifestyle management  Age 34+ start Mammogram screening routine, ordered to [facility], she will call to schedule.  TDap today, good for 10 years   In 2024, if you can proceed w/ calling to schedule a new apt for Cervical Cancer Screening pap smear.  Let me know if you need a referral and which provider you prefer  Your last pap was 12/12/17, normal negative  [facility] at [address] [place],  [place]  [address] Get Driving Directions Main: [phone]   Orders Placed This Encounter  Procedures   MM 3D SCREEN BREAST BILATERAL    Standing Status:   Future    Standing Expiration Date:   03/31/2023    Order Specific Question:   Reason for Exam (SYMPTOM  OR DIAGNOSIS REQUIRED)    Answer:   Screening bilateral 3D Mammogram [place]    Order Specific Question:   Preferred imaging location?    Answer:   [facility]   Tdap vaccine greater than or equal to 7yo IM     No orders of the defined types were placed in this encounter.     Follow up plan: Return in about 1 year (around 03/31/2023) for 1 year fasting lab only then 1 week later Annual Physical.  Future labs 03/2023  Nobie Putnam, [facility] 03/30/2022, 2:58 PM

## 2022-03-30 NOTE — Patient Instructions (Addendum)
Thank you for coming to the office today.  Tdap today, good for 10 years.   For Mammogram screening for breast cancer   Age 40+  Call the Imaging Center below anytime to schedule your own appointment now that order has been placed.  [facility] [address] [place], [place] [address] Phone: [phone]  ----  In 2024, if you can proceed w/ calling to schedule a new apt for Cervical Cancer Screening pap smear.  Let me know if you need a referral and which provider you prefer  Your last pap was 12/12/17  [facility] at [facility] [address] [place],  [place]  [address] Get Driving Directions Main: [phone]   Excellent improved cholesterol Increased HDL and reduced LDL 15 patients, now < 70.  Recent Labs    03/21/22 0849  HGBA1C 5.5     DUE for FASTING BLOOD WORK (no food or drink after midnight before the lab appointment, only water or coffee without cream/sugar on the morning of)  SCHEDULE "Lab Only" visit in the morning at the clinic for lab draw in 1 YEAR  - Make sure Lab Only appointment is at about 1 week before your next appointment, so that results will be available  For Lab Results, once available within 2-3 days of blood draw, you can can log in to MyChart online to view your results and a brief explanation. Also, we can discuss results at next follow-up visit.    Please schedule a Follow-up Appointment to: Return in about 1 year (around 03/31/2023) for 1 year fasting lab only then 1 week later Annual Physical.  If you have any other questions or concerns, please feel free to call the office or send a message through MyChart. You may also schedule an earlier appointment if necessary.  Additionally, you may be receiving a survey about your experience at our office within a few days to 1 week by e-mail or mail. We value your feedback.  Saralyn Pilar, DO [facility], [place]

## 2022-05-17 ENCOUNTER — Ambulatory Visit: Payer: Self-pay

## 2022-05-17 NOTE — Telephone Encounter (Signed)
  Chief Complaint: Body aches, fatigue Symptoms: above Frequency: Saturday Pertinent Negatives: Patient denies fever, cough Disposition: [] ED /[] Urgent Care (no appt availability in office) / [x] Appointment(In office/virtual)/ []  [facility]/ [] Home Care/ [] Refused Recommended Disposition /[] [facility]/ []  Follow-up with PCP Additional Notes: Pt will treat at home. Pt would ike to be swabbed for Flu, COVID. PT has appt scheduled for tomorrow PT will cancel this appointment if she is feeling better.    Summary: what to do for flu like symptoms?   Pt states she is aware lots of sickness going around.  Pt is having body aches, not feeling well.  Would like to know if you think it is beneficial to push fluids? Anything else she can do to help things along?     Reason for Disposition  [1] Probable mild influenza (no fever) or a common cold, with no complications AND [8] NOT HIGH RISK  Answer Assessment - Initial Assessment Questions 1. WORST SYMPTOM: "What is your worst symptom?" (e.g., cough, runny nose, muscle aches, headache, sore throat, fever)      Body aches, fatigue 2. ONSET: "When did your flu symptoms start?"      Sunday 3. COUGH: "How bad is the cough?"       none 4. RESPIRATORY DISTRESS: "Describe your breathing."      none 5. FEVER: "Do you have a fever?" If Yes, ask: "What is your temperature, how was it measured, and when did it start?"     98.8 6. EXPOSURE: "Were you exposed to someone with influenza?"       unsure 7. FLU VACCINE: "Did you get a flu shot this year?"      8. HIGH RISK DISEASE: "Do you have any chronic medical problems?" (e.g., heart or lung disease, asthma, weak immune system, or other HIGH RISK conditions)      9. PREGNANCY: "Is there any chance you are pregnant?" "When was your last menstrual period?"      10 . OTHER SYMPTOMS: "Do you have any other symptoms?"  (e.g., runny nose, muscle aches, headache, sore throat)  Protocols  used: Influenza - [facility]

## 2022-05-18 ENCOUNTER — Encounter: Payer: Self-pay | Admitting: Internal Medicine

## 2022-05-18 ENCOUNTER — Ambulatory Visit: Payer: BC Managed Care – PPO | Admitting: Internal Medicine

## 2022-05-18 VITALS — BP 108/69 | HR 87 | Temp 97.1°F | Wt 137.0 lb

## 2022-05-18 DIAGNOSIS — U071 COVID-19: Secondary | ICD-10-CM | POA: Diagnosis not present

## 2022-05-18 LAB — POC COVID19 BINAXNOW: SARS Coronavirus 2 Ag: POSITIVE — AB

## 2022-05-18 LAB — POCT INFLUENZA A/B
Influenza A, POC: NEGATIVE
Influenza B, POC: NEGATIVE

## 2022-05-18 MED ORDER — PROMETHAZINE-DM 6.25-15 MG/5ML PO SYRP: 5.0000 mL | ORAL_SOLUTION | Freq: Four times a day (QID) | ORAL | 0 refills | Status: DC | PRN

## 2022-05-18 MED ORDER — PREDNISONE 20 MG PO TABS: 20.0000 mg | ORAL_TABLET | Freq: Every day | ORAL | 0 refills | Status: DC

## 2022-05-18 NOTE — Patient Instructions (Signed)

## 2022-05-18 NOTE — Progress Notes (Signed)
Subjective:    Patient ID: Cheryl [name], female    DOB: 11-02-1980, 42 y.o.   MRN: [id number]  HPI  Patient presents to clinic today with complaint of fatigue, body aches, runny nose, nasal congestion, sore throat and cough.  This started 5 days ago.  She is blowing clear mucus out of her nose.  She denies difficulty swallowing.  The cough is dry nonproductive.  The cough is worse at night.  She denies headache, ear pain, chest pain, nausea, vomiting or diarrhea.  She denies fever or chills.  She has tried NyQuil OTC with some relief of symptoms.  She has had sick contacts with similar symptoms.  Review of Systems     Past Medical History:  Diagnosis Date   Bacterial pneumonia    age 84   Postpartum hemorrhage    G1   Urinary retention    after second delivery    Current Outpatient Medications  Medication Sig Dispense Refill   levonorgestrel (MIRENA) 20 MCG/24HR IUD 1 each by Intrauterine route once.     No current facility-administered medications for this visit.    No Known Allergies  Family History  Problem Relation Age of Onset   Diabetes Mother        type 2 and GDM   Hypertension Mother    Deep vein thrombosis Mother    Hypertension Father    Mental illness Brother    Diabetes Maternal Grandmother    Heart disease Maternal Grandmother    Alzheimer's disease Paternal Actor    Healthy Daughter    Healthy Daughter    Breast cancer Neg Hx     Social History   Socioeconomic History   Marital status: Married    Spouse name: Not on file   Number of children: 2   Years of education: 19   Highest education level: Patent examiner History   Occupation: PHYSICAL THERAPIST    Comment: [employer]  Tobacco Use   Smoking status: Never   Smokeless tobacco: Never  Vaping Use   Vaping Use: Never used  Substance and Sexual Activity   Alcohol use: Yes    Comment: OCC   Drug use: Never   Sexual activity: Yes    Birth control/protection:  I.U.D.    Comment: Mirena  Other Topics Concern   Not on file  Social History Narrative   Not on file   Social Determinants of Health   Financial Resource Strain: Not on file  Food Insecurity: Not on file  Transportation Needs: Not on file  Physical Activity: Not on file  Stress: Not on file  Social Connections: Socially Integrated (04/24/2018)   Social Connection and Isolation Panel [NHANES]    Frequency of Communication with Friends and Family: More than three times a week    Frequency of Social Gatherings with Friends and Family: More than three times a week    Attends Religious Services: More than 4 times per year    Active Member of [employer] or Organizations: Yes    Attends Banker Meetings: More than 4 times per year    Marital Status: Married  Catering manager Violence: Not At Risk (04/24/2018)   Humiliation, Afraid, Rape, and Kick questionnaire    Fear of Current or Ex-Partner: No    Emotionally Abused: No    Physically Abused: No    Sexually Abused: No     Constitutional: Patient reports fatigue.  Denies fever, malaise, headache or abrupt weight changes.  HEENT: Patient reports runny nose, nasal congestion and sore throat.  Denies eye pain, eye redness, ear pain, ringing in the ears, wax buildup, bloody nose. Respiratory: Patient reports cough.  Denies difficulty breathing, shortness of breath, or sputum production.   Cardiovascular: Denies chest pain, chest tightness, palpitations or swelling in the hands or feet.  Gastrointestinal: Denies abdominal pain, bloating, constipation, diarrhea or blood in the stool.  Musculoskeletal: Patient reports body aches.  Denies decrease in range of motion, difficulty with gait, or joint swelling.  Skin: Denies redness, rashes, lesions or ulcercations.   No other specific complaints in a complete review of systems (except as listed in HPI above).  Objective:   Physical Exam   BP 108/69 (BP Location: Right Arm, Patient  Position: Sitting, Cuff Size: Normal)   Pulse 87   Temp (!) 97.1 F (36.2 C) (Temporal)   Wt 137 lb (62.1 kg)   SpO2 100%   BMI 21.46 kg/m   Wt Readings from Last 3 Encounters:  03/30/22 138 lb 3.2 oz (62.7 kg)  03/22/21 178 lb 12.8 oz (81.1 kg)  09/08/20 172 lb (78 kg)    General: Appears her stated age, well developed, well nourished in NAD. Skin: Warm, dry and intact. No rashes noted. HEENT: Head: normal shape and size, no sinus tenderness noted; Eyes: sclera white, no icterus, conjunctiva pink, PERRLA and EOMs intact; Throat/Mouth: Teeth present, mucosa erythematous and moist, no exudate, lesions or ulcerations noted.  Neck: Bilateral anterior cervical adenopathy noted. Cardiovascular: Normal rate and rhythm. S1,S2 noted.  No murmur, rubs or gallops noted.  Pulmonary/Chest: Normal effort and positive vesicular breath sounds. No respiratory distress. No wheezes, rales or ronchi noted.  Neurological: Alert and oriented.   BMET    Component Value Date/Time   NA 140 03/21/2022 0849   K 3.9 03/21/2022 0849   CL 107 03/21/2022 0849   CO2 25 03/21/2022 0849   GLUCOSE 88 03/21/2022 0849   BUN 14 03/21/2022 0849   CREATININE 0.73 03/21/2022 0849   CALCIUM 9.0 03/21/2022 0849   GFRNONAA >60 10/21/2020 1450   GFRNONAA 95 07/24/2019 0911   GFRAA 110 07/24/2019 0911    Lipid Panel     Component Value Date/Time   CHOL 158 03/21/2022 0849   TRIG 54 03/21/2022 0849   HDL 76 03/21/2022 0849   CHOLHDL 2.1 03/21/2022 0849   LDLCALC 69 03/21/2022 0849    CBC    Component Value Date/Time   WBC 6.2 03/21/2022 0849   RBC 4.41 03/21/2022 0849   HGB 13.0 03/21/2022 0849   HGB 11.9 (L) 02/05/2013 2041   HCT 39.3 03/21/2022 0849   HCT 33.4 (L) 02/07/2013 0508   PLT 179 03/21/2022 0849   PLT 193 02/05/2013 2041   MCV 89.1 03/21/2022 0849   MCV 90 02/05/2013 2041   MCH 29.5 03/21/2022 0849   MCHC 33.1 03/21/2022 0849   RDW 12.4 03/21/2022 0849   RDW 13.6 02/05/2013 2041    LYMPHSABS 1,662 03/21/2022 0849   LYMPHSABS 2.8 02/05/2013 2041   MONOABS 0.7 10/21/2020 1450   MONOABS 0.8 02/05/2013 2041   EOSABS 68 03/21/2022 0849   EOSABS 0.1 02/05/2013 2041   BASOSABS 50 03/21/2022 0849   BASOSABS 0.1 02/05/2013 2041    Hgb A1C Lab Results  Component Value Date   HGBA1C 5.5 03/21/2022           Assessment & Plan:   COVID-19:  Encourage rest and fluids Discussed use of Paxlovid, however she would  like to hold off at this time Rx for Prednisone 20 mg daily for symptom management Rx for Promethazine DM for cough-sedation caution given Work note provided  Follow-up with your PCP as previously scheduled Webb Silversmith, NP

## 2022-07-14 ENCOUNTER — Ambulatory Visit
Admission: RE | Admit: 2022-07-14 | Discharge: 2022-07-14 | Disposition: A | Discharge status: Home / Self Care | Payer: BC Managed Care – PPO | Source: Ambulatory Visit | Attending: Family Medicine | Admitting: Family Medicine

## 2022-07-14 DIAGNOSIS — Z1231 Encounter for screening mammogram for malignant neoplasm of breast: Secondary | ICD-10-CM

## 2022-07-15 ENCOUNTER — Other Ambulatory Visit: Payer: Self-pay | Admitting: Family Medicine

## 2022-07-15 DIAGNOSIS — R928 Other abnormal and inconclusive findings on diagnostic imaging of breast: Secondary | ICD-10-CM

## 2022-07-20 ENCOUNTER — Ambulatory Visit
Admission: RE | Admit: 2022-07-20 | Discharge: 2022-07-20 | Disposition: A | Discharge status: Home / Self Care | Payer: BC Managed Care – PPO | Source: Ambulatory Visit | Attending: Family Medicine | Admitting: Family Medicine

## 2022-07-20 DIAGNOSIS — R921 Mammographic calcification found on diagnostic imaging of breast: Secondary | ICD-10-CM | POA: Diagnosis not present

## 2022-07-20 DIAGNOSIS — R928 Other abnormal and inconclusive findings on diagnostic imaging of breast: Secondary | ICD-10-CM | POA: Diagnosis not present

## 2022-07-21 ENCOUNTER — Other Ambulatory Visit: Payer: Self-pay | Admitting: Family Medicine

## 2022-07-21 DIAGNOSIS — R921 Mammographic calcification found on diagnostic imaging of breast: Secondary | ICD-10-CM

## 2022-07-21 DIAGNOSIS — R928 Other abnormal and inconclusive findings on diagnostic imaging of breast: Secondary | ICD-10-CM

## 2022-07-27 ENCOUNTER — Ambulatory Visit
Admission: RE | Admit: 2022-07-27 | Discharge: 2022-07-27 | Disposition: A | Discharge status: Home / Self Care | Payer: BC Managed Care – PPO | Source: Ambulatory Visit | Attending: Family Medicine | Admitting: Family Medicine

## 2022-07-27 DIAGNOSIS — R928 Other abnormal and inconclusive findings on diagnostic imaging of breast: Secondary | ICD-10-CM | POA: Insufficient documentation

## 2022-07-27 DIAGNOSIS — R921 Mammographic calcification found on diagnostic imaging of breast: Secondary | ICD-10-CM | POA: Insufficient documentation

## 2022-07-27 DIAGNOSIS — N6082 Other benign mammary dysplasias of left breast: Secondary | ICD-10-CM | POA: Diagnosis not present

## 2022-07-27 HISTORY — PX: BREAST BIOPSY: SHX20

## 2022-07-27 MED ORDER — LIDOCAINE-EPINEPHRINE 1 %-1:100000 IJ SOLN
10.0000 mL | Freq: Once | INTRAMUSCULAR | Status: AC
  Administered 2022-07-27: 10 mL
  Filled 2022-07-27: qty 10

## 2022-07-27 MED ORDER — LIDOCAINE HCL (PF) 1 % IJ SOLN
15.0000 mL | Freq: Once | INTRAMUSCULAR | Status: AC
  Administered 2022-07-27: 15 mL
  Filled 2022-07-27: qty 15

## 2022-07-28 LAB — SURGICAL PATHOLOGY

## 2022-08-26 DIAGNOSIS — D2262 Melanocytic nevi of left upper limb, including shoulder: Secondary | ICD-10-CM | POA: Diagnosis not present

## 2022-08-26 DIAGNOSIS — D2272 Melanocytic nevi of left lower limb, including hip: Secondary | ICD-10-CM | POA: Diagnosis not present

## 2022-08-26 DIAGNOSIS — D485 Neoplasm of uncertain behavior of skin: Secondary | ICD-10-CM | POA: Diagnosis not present

## 2022-08-26 DIAGNOSIS — D0371 Melanoma in situ of right lower limb, including hip: Secondary | ICD-10-CM | POA: Diagnosis not present

## 2022-08-26 DIAGNOSIS — D2261 Melanocytic nevi of right upper limb, including shoulder: Secondary | ICD-10-CM | POA: Diagnosis not present

## 2022-08-26 DIAGNOSIS — D225 Melanocytic nevi of trunk: Secondary | ICD-10-CM | POA: Diagnosis not present

## 2022-10-06 DIAGNOSIS — D0371 Melanoma in situ of right lower limb, including hip: Secondary | ICD-10-CM | POA: Diagnosis not present

## 2022-11-14 IMAGING — RF DG C-ARM 1-60 MIN
1 series · 1 of 1 positions shown · IV contrast (agent unspecified)
Comparison: X-ray lumbar spine 07/17/2019

CLINICAL DATA: Lumbar surgery.

EXAM:
DG C-ARM 1-60 MIN; LUMBAR SPINE - 2-3 VIEW
CONTRAST:  none
FLUOROSCOPY TIME:  Fluoroscopy Time: 14 seconds
Number of Acquired Spot Images: 1

[Series 1: run · 1 of 1 slices shown]
[im 1/1]
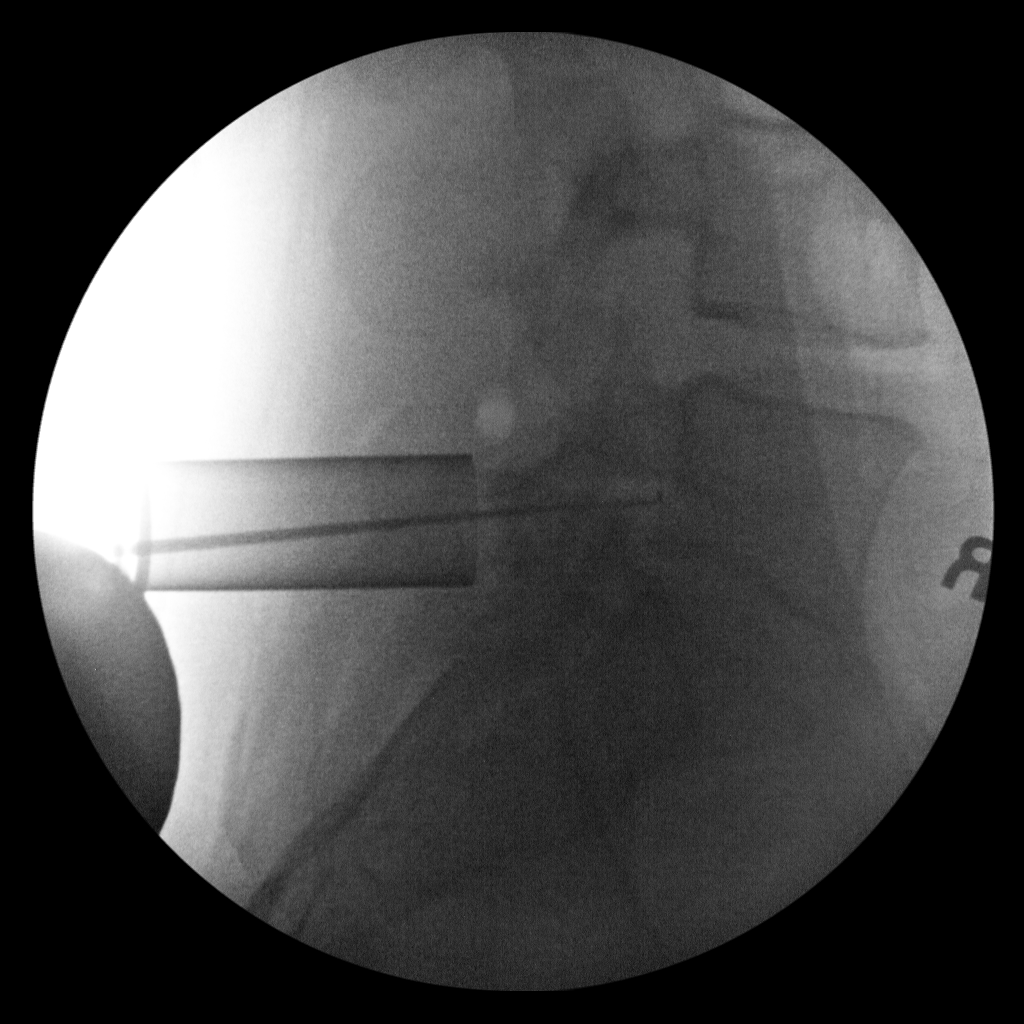

[1 of 1 positions shown; findings below may reference images not displayed]

FINDINGS: Surgical instrument at the level of the L5 vertebral body.
IMPRESSION: Surgical instrument at the level of the L5 vertebral body.

## 2023-04-03 ENCOUNTER — Other Ambulatory Visit: Payer: BC Managed Care – PPO

## 2023-04-03 DIAGNOSIS — Z0000 Encounter for general adult medical examination without abnormal findings: Secondary | ICD-10-CM

## 2023-04-03 DIAGNOSIS — E7800 Pure hypercholesterolemia, unspecified: Secondary | ICD-10-CM

## 2023-04-03 DIAGNOSIS — R7309 Other abnormal glucose: Secondary | ICD-10-CM | POA: Diagnosis not present

## 2023-04-04 LAB — CBC WITH DIFFERENTIAL/PLATELET
Absolute Lymphocytes: 1792 {cells}/uL (ref 850–3900)
Absolute Monocytes: 406 {cells}/uL (ref 200–950)
Basophils Absolute: 63 {cells}/uL (ref 0–200)
Basophils Relative: 0.9 %
Eosinophils Absolute: 91 {cells}/uL (ref 15–500)
Eosinophils Relative: 1.3 %
HCT: 40.4 % (ref 35.0–45.0)
Hemoglobin: 13.2 g/dL (ref 11.7–15.5)
MCH: 29.6 pg (ref 27.0–33.0)
MCHC: 32.7 g/dL (ref 32.0–36.0)
MCV: 90.6 fL (ref 80.0–100.0)
MPV: 9.6 fL (ref 7.5–12.5)
Monocytes Relative: 5.8 %
Neutro Abs: 4648 {cells}/uL (ref 1500–7800)
Neutrophils Relative %: 66.4 %
Platelets: 183 10*3/uL (ref 140–400)
RBC: 4.46 10*6/uL (ref 3.80–5.10)
RDW: 12.1 % (ref 11.0–15.0)
Total Lymphocyte: 25.6 %
WBC: 7 10*3/uL (ref 3.8–10.8)

## 2023-04-04 LAB — COMPLETE METABOLIC PANEL WITH GFR
AG Ratio: 2.2 (calc) (ref 1.0–2.5)
ALT: 11 U/L (ref 6–29)
AST: 11 U/L (ref 10–30)
Albumin: 4.6 g/dL (ref 3.6–5.1)
Alkaline phosphatase (APISO): 37 U/L (ref 31–125)
BUN: 12 mg/dL (ref 7–25)
CO2: 27 mmol/L (ref 20–32)
Calcium: 9.3 mg/dL (ref 8.6–10.2)
Chloride: 106 mmol/L (ref 98–110)
Creat: 0.76 mg/dL (ref 0.50–0.99)
Globulin: 2.1 g/dL (ref 1.9–3.7)
Glucose, Bld: 91 mg/dL (ref 65–99)
Potassium: 4.1 mmol/L (ref 3.5–5.3)
Sodium: 141 mmol/L (ref 135–146)
Total Bilirubin: 0.7 mg/dL (ref 0.2–1.2)
Total Protein: 6.7 g/dL (ref 6.1–8.1)
eGFR: 101 mL/min/{1.73_m2} (ref >=60)

## 2023-04-04 LAB — HEMOGLOBIN A1C
Hgb A1c MFr Bld: 5.3 %{Hb} (ref <5.7)
Mean Plasma Glucose: 105 mg/dL
eAG (mmol/L): 5.8 mmol/L

## 2023-04-04 LAB — LIPID PANEL
Cholesterol: 177 mg/dL (ref <200)
HDL: 84 mg/dL (ref >=50)
LDL Cholesterol (Calc): 77 mg/dL
Non-HDL Cholesterol (Calc): 93 mg/dL (ref <130)
Total CHOL/HDL Ratio: 2.1 (calc) (ref <5.0)
Triglycerides: 82 mg/dL (ref <150)

## 2023-04-04 LAB — TSH: TSH: 5.48 m[IU]/L — ABNORMAL HIGH

## 2023-04-10 ENCOUNTER — Ambulatory Visit (INDEPENDENT_AMBULATORY_CARE_PROVIDER_SITE_OTHER): Payer: BC Managed Care – PPO | Admitting: Family Medicine

## 2023-04-10 ENCOUNTER — Encounter: Payer: Self-pay | Admitting: Family Medicine

## 2023-04-10 ENCOUNTER — Other Ambulatory Visit: Payer: Self-pay | Admitting: Family Medicine

## 2023-04-10 VITALS — BP 110/68 | Ht 67.0 in | Wt 148.0 lb

## 2023-04-10 DIAGNOSIS — Z0000 Encounter for general adult medical examination without abnormal findings: Secondary | ICD-10-CM | POA: Diagnosis not present

## 2023-04-10 DIAGNOSIS — R7989 Other specified abnormal findings of blood chemistry: Secondary | ICD-10-CM

## 2023-04-10 NOTE — Patient Instructions (Addendum)
Thank you for coming to the office today.  Check in with GYN and see if you can get re-established in 2025. Due for pap smear.  [facility] at [facility] [address] [place],  [place]  [address] Main: [phone]  Stay tuned for Thyroid panel upcoming 3 months.  DUE for FASTING BLOOD WORK (no food or drink after midnight before the lab appointment, only water or coffee without cream/sugar on the morning of)  SCHEDULE "Lab Only" visit in the morning at the clinic for lab draw in 3 MONTHS   - Make sure Lab Only appointment is at about 1 week before your next appointment, so that results will be available  For Lab Results, once available within 2-3 days of blood draw, you can can log in to MyChart online to view your results and a brief explanation. Also, we can discuss results at next follow-up visit.   Please schedule a Follow-up Appointment to: Return in about 3 months (around 07/11/2023) for 3 month fasting lab only - no apt after, mychart review.  If you have any other questions or concerns, please feel free to call the office or send a message through MyChart. You may also schedule an earlier appointment if necessary.  Additionally, you may be receiving a survey about your experience at our office within a few days to 1 week by e-mail or mail. We value your feedback.  Saralyn Pilar, DO [facility], [place]

## 2023-04-10 NOTE — Progress Notes (Signed)
Subjective:    Patient ID: Cheryl [name], female    DOB: 06/30/1980, 42 y.o.   MRN: [id number]  Cheryl [name] is a 42 y.o. female presenting on 04/10/2023 for Annual Exam   HPI  Discussed the use of AI scribe software for clinical note transcription with the patient, who gave verbal consent to proceed.       Here for Annual Exam and Lab Review  BMI 23 / Lifestyle Some weight gain 6 lbs up from previous 6 months Admits some salty intake and fluid weight. Admits too many carbs She is off her strict diet. She is making healthy decisions Goals to improve lifestyle  Elevated TSH 5.48, mild elevation, compared to previous 4.36. No T4 lab done. She is asymptomatic but does admit chronic feeling tired. Never on treatment. Unsure if any significant fam history  S/p Lumbar microdiskectomy 10/21/20 Followed by Dr Maurice Small Improved now following spinal procedure No further issues or flare ups   [facility]ed [facility] Dr Beatriz Chancellor, now once yearly screening, multiple moles - Diagnosed with melanoma in situ - excised and treated.   History of Bladder issues with overdistended bladder when had her daughter, on flomax short term, occasional issue with difficulty voiding at start but no other routine problems. - Off of medication     Health Maintenance:   UTD Vaccines.   Updated Flu Shot this year, 02/2023.   Last pap smear 12/12/17  Negative pap and negative HPV co-testing - [facility] - Next due Pap Smear 2024-2025 - she will try to re-establish with [facility], also has Mirena IUD in place interested in updating.   Completed Mammogram 06/2022 - uncertain area seen, repeat with [place] and Diagnostic, then proceeded to Biopsy, consistent with fibrocystic and fibroadenomatoid tissue.       04/10/2023    8:14 AM 03/30/2022    2:48 PM 03/22/2021    1:37 PM  Depression screen PHQ 2/9  Decreased Interest 0 0 0  Down, Depressed, Hopeless 0 0 0  PHQ - 2  Score 0 0 0  Altered sleeping  0 0  Tired, decreased energy  0 1  Change in appetite  0 0  Feeling bad or failure about yourself   0 0  Trouble concentrating  0 0  Moving slowly or fidgety/restless  0 0  Suicidal thoughts  0 0  PHQ-9 Score  0 1  Difficult doing work/chores  Not difficult at all        04/10/2023    8:14 AM 03/30/2022    2:49 PM 03/22/2021    1:37 PM  GAD 7 : Generalized Anxiety Score  Nervous, Anxious, on Edge 0 0 0  Control/stop worrying 0 0 0  Worry too much - different things 0 0 0  Trouble relaxing 0 0 0  Restless 0 0 0  Easily annoyed or irritable 0 0 0  Afraid - awful might happen 0 0 0  Total GAD 7 Score 0 0 0  Anxiety Difficulty Not difficult at all Not difficult at all Not difficult at all     Past Medical History:  Diagnosis Date   Bacterial pneumonia    age 85   Postpartum hemorrhage    G1   Urinary retention    after second delivery   Past Surgical History:  Procedure Laterality Date   BREAST BIOPSY Left 07/27/2022   Stereo Bx X Clip- path pending   BREAST BIOPSY Left 07/27/2022  MM LT BREAST BX W LOC DEV 1ST LESION IMAGE BX SPEC STEREO GUIDE 07/27/2022 [facility]   LUMBAR LAMINECTOMY/ DECOMPRESSION WITH MET-RX Right 10/21/2020   Procedure: RIGHT Lumbar five - Sacral one MIS DISCECTOMY WITH MET-RX;  Surgeon: Jadene Pierini, MD;  Location: [facility] OR;  Service: Neurosurgery;  Laterality: Right;   MANDIBLE SURGERY  1999   TONSILLECTOMY AND ADENOIDECTOMY     middle school age   71 TOOTH EXTRACTION     middle school/high school - all four   Social History   Socioeconomic History   Marital status: Married    Spouse name: Not on file   Number of children: 2   Years of education: 19   Highest education level: Patent examiner History   Occupation: PHYSICAL THERAPIST    Comment: [employer]  Tobacco Use   Smoking status: Never   Smokeless tobacco: Never  Vaping Use   Vaping status: Never Used  Substance  and Sexual Activity   Alcohol use: Yes    Comment: OCC   Drug use: Never   Sexual activity: Yes    Birth control/protection: I.U.D.    Comment: Mirena  Other Topics Concern   Not on file  Social History Narrative   Not on file   Social Determinants of Health   Financial Resource Strain: Patient Declined (04/10/2023)   Overall Financial Resource Strain (CARDIA)    Difficulty of Paying Living Expenses: Patient declined  Food Insecurity: Patient Declined (04/10/2023)   Hunger Vital Sign    Worried About Running Out of Food in the Last Year: Patient declined    Ran Out of Food in the Last Year: Patient declined  Transportation Needs: Patient Declined (04/10/2023)   PRAPARE - Administrator, Civil Service (Medical): Patient declined    Lack of Transportation (Non-Medical): Patient declined  Physical Activity: Patient Declined (04/10/2023)   Exercise Vital Sign    Days of Exercise per Week: Patient declined    Minutes of Exercise per Session: Patient declined  Stress: Patient Declined (04/10/2023)   [employer] of Occupational Health - Occupational Stress Questionnaire    Feeling of Stress : Patient declined  Social Connections: Patient Declined (04/10/2023)   Social Connection and Isolation Panel [NHANES]    Frequency of Communication with Friends and Family: Patient declined    Frequency of Social Gatherings with Friends and Family: Patient declined    Attends Religious Services: Patient declined    Database administrator or Organizations: Patient declined    Attends Banker Meetings: Patient declined    Marital Status: Patient declined  Intimate Partner Violence: Patient Declined (04/10/2023)   Humiliation, Afraid, Rape, and Kick questionnaire    Fear of Current or Ex-Partner: Patient declined    Emotionally Abused: Patient declined    Physically Abused: Patient declined    Sexually Abused: Patient declined   Family History  Problem Relation  Age of Onset   Diabetes Mother        type 2 and GDM   Hypertension Mother    Deep vein thrombosis Mother    Hypertension Father    Mental illness Brother    Diabetes Maternal Grandmother    Heart disease Maternal Grandmother    Alzheimer's disease Paternal Grandfather    Healthy Daughter    Healthy Daughter    Breast cancer Neg Hx    Current Outpatient Medications on File Prior to Visit  Medication Sig   levonorgestrel (MIRENA) 20 MCG/24HR IUD  1 each by Intrauterine route once.   No current facility-administered medications on file prior to visit.    Review of Systems  Constitutional:  Negative for activity change, appetite change, chills, diaphoresis, fatigue and fever.  HENT:  Negative for congestion and hearing loss.   Eyes:  Negative for visual disturbance.  Respiratory:  Negative for cough, chest tightness, shortness of breath and wheezing.   Cardiovascular:  Negative for chest pain, palpitations and leg swelling.  Gastrointestinal:  Negative for abdominal pain, constipation, diarrhea, nausea and vomiting.  Genitourinary:  Negative for dysuria, frequency and hematuria.  Musculoskeletal:  Negative for arthralgias and neck pain.  Skin:  Negative for rash.  Neurological:  Negative for dizziness, weakness, light-headedness, numbness and headaches.  Hematological:  Negative for adenopathy.  Psychiatric/Behavioral:  Negative for behavioral problems, dysphoric mood and sleep disturbance.    Per HPI unless specifically indicated above     Objective:    BP 110/68   Ht 5\' 7"  (1.702 m)   Wt 148 lb (67.1 kg)   LMP  (LMP Unknown)   BMI 23.18 kg/m   Wt Readings from Last 3 Encounters:  04/10/23 148 lb (67.1 kg)  05/18/22 137 lb (62.1 kg)  03/30/22 138 lb 3.2 oz (62.7 kg)    Physical Exam Vitals and nursing note reviewed.  Constitutional:      General: She is not in acute distress.    Appearance: She is well-developed. She is not diaphoretic.     Comments:  Well-appearing, comfortable, cooperative  HENT:     Head: Normocephalic and atraumatic.     Right Ear: Tympanic membrane, ear canal and external ear normal. There is no impacted cerumen.     Left Ear: Tympanic membrane, ear canal and external ear normal. There is no impacted cerumen.  Eyes:     General:        Right eye: No discharge.        Left eye: No discharge.     Conjunctiva/sclera: Conjunctivae normal.     Pupils: Pupils are equal, round, and reactive to light.  Neck:     Thyroid: No thyromegaly.     Vascular: No carotid bruit.  Cardiovascular:     Rate and Rhythm: Normal rate and regular rhythm.     Pulses: Normal pulses.     Heart sounds: Normal heart sounds. No murmur heard. Pulmonary:     Effort: Pulmonary effort is normal. No respiratory distress.     Breath sounds: Normal breath sounds. No wheezing or rales.  Abdominal:     General: Bowel sounds are normal. There is no distension.     Palpations: Abdomen is soft. There is no mass.     Tenderness: There is no abdominal tenderness.  Musculoskeletal:        General: No tenderness. Normal range of motion.     Cervical back: Normal range of motion and neck supple.     Right lower leg: No edema.     Left lower leg: No edema.     Comments: Upper / Lower Extremities: - Normal muscle tone, strength bilateral upper extremities 5/5, lower extremities 5/5  Lymphadenopathy:     Cervical: No cervical adenopathy.  Skin:    General: Skin is warm and dry.     Findings: No erythema or rash.  Neurological:     Mental Status: She is alert and oriented to person, place, and time.     Comments: Distal sensation intact to light touch all extremities  Psychiatric:        Mood and Affect: Mood normal.        Behavior: Behavior normal.        Thought Content: Thought content normal.     Comments: Well groomed, good eye contact, normal speech and thoughts       Results for orders placed or performed in visit on 04/03/23  TSH   Result Value Ref Range   TSH 5.48 (H) mIU/L  Hemoglobin A1c  Result Value Ref Range   Hgb A1c MFr Bld 5.3 <5.7 % of total Hgb   Mean Plasma Glucose 105 mg/dL   eAG (mmol/L) 5.8 mmol/L  Lipid panel  Result Value Ref Range   Cholesterol 177 <200 mg/dL   HDL 84 > OR = 50 mg/dL   Triglycerides 82 <657 mg/dL   LDL Cholesterol (Calc) 77 mg/dL (calc)   Total CHOL/HDL Ratio 2.1 <5.0 (calc)   Non-HDL Cholesterol (Calc) 93 <846 mg/dL (calc)  CBC with Differential/Platelet  Result Value Ref Range   WBC 7.0 3.8 - 10.8 Thousand/uL   RBC 4.46 3.80 - 5.10 Million/uL   Hemoglobin 13.2 11.7 - 15.5 g/dL   HCT 96.2 95.2 - 84.1 %   MCV 90.6 80.0 - 100.0 fL   MCH 29.6 27.0 - 33.0 pg   MCHC 32.7 32.0 - 36.0 g/dL   RDW 32.4 40.1 - 02.7 %   Platelets 183 140 - 400 Thousand/uL   MPV 9.6 7.5 - 12.5 fL   Neutro Abs 4,648 1,500 - 7,800 cells/uL   Absolute Lymphocytes 1,792 850 - 3,900 cells/uL   Absolute Monocytes 406 200 - 950 cells/uL   Eosinophils Absolute 91 15 - 500 cells/uL   Basophils Absolute 63 0 - 200 cells/uL   Neutrophils Relative % 66.4 %   Total Lymphocyte 25.6 %   Monocytes Relative 5.8 %   Eosinophils Relative 1.3 %   Basophils Relative 0.9 %  COMPLETE METABOLIC PANEL WITH GFR  Result Value Ref Range   Glucose, Bld 91 65 - 99 mg/dL   BUN 12 7 - 25 mg/dL   Creat 2.53 6.64 - 4.03 mg/dL   eGFR 474 > OR = 60 QV/ZDG/3.87F6   BUN/Creatinine Ratio SEE NOTE: 6 - 22 (calc)   Sodium 141 135 - 146 mmol/L   Potassium 4.1 3.5 - 5.3 mmol/L   Chloride 106 98 - 110 mmol/L   CO2 27 20 - 32 mmol/L   Calcium 9.3 8.6 - 10.2 mg/dL   Total Protein 6.7 6.1 - 8.1 g/dL   Albumin 4.6 3.6 - 5.1 g/dL   Globulin 2.1 1.9 - 3.7 g/dL (calc)   AG Ratio 2.2 1.0 - 2.5 (calc)   Total Bilirubin 0.7 0.2 - 1.2 mg/dL   Alkaline phosphatase (APISO) 37 31 - 125 U/L   AST 11 10 - 30 U/L   ALT 11 6 - 29 U/L      Assessment & Plan:   Problem List Items Addressed This Visit   None Visit Diagnoses     Annual  physical exam    -  Primary        Updated Health Maintenance information Reviewed recent lab results with patient Encouraged improvement to lifestyle with diet and exercise Goal of weight loss   Mildly elevated TSH Asymptomatic TSH mildly elevated at 5.48 (normal range 0.4-4.5). No symptoms of hypothyroidism reported. Previous TSH was 3.6, within normal range. -Repeat TSH and add free T4 in 3 months (July 13, 2023) to confirm  if there is a trend towards hypothyroidism.  Skin Cancer / Melanoma In Situ - based on history History of stage 0 skin cancer on the leg, removed in May/June 2024. No new skin lesions reported. -Continue surveillance with dermatology  Gynecological Health Due for Pap smear, previous provider retired and patient needs to establish care with a new provider. -Encouraged to establish care with a new GYN provider for Pap smear and Mirena IUD check (in place for approximately 5 years).  Weight Management Self-reported weight gain of approximately 6-8 lbs over the past year. BMI still within healthy range. -Encouraged to continue monitoring weight and making healthy dietary choices.  General Health Maintenance -Continue annual mammogram screenings after last round requiring diagnostic/US/biopsy, next due in February 2025  -Colon cancer screening due at age 13+, will recommend Cologuard  -Flu vaccine up to date, received in October 2024.       No orders of the defined types were placed in this encounter.   No orders of the defined types were placed in this encounter.    Follow up plan: Return in about 3 months (around 07/11/2023) for 3 month fasting lab only - no apt after, mychart review.  Future labs 07/13/23 TSH + Free T4   Saralyn Pilar, DO [facility] 04/10/2023, 8:10 AM

## 2023-06-14 DIAGNOSIS — D2262 Melanocytic nevi of left upper limb, including shoulder: Secondary | ICD-10-CM | POA: Diagnosis not present

## 2023-06-14 DIAGNOSIS — D2272 Melanocytic nevi of left lower limb, including hip: Secondary | ICD-10-CM | POA: Diagnosis not present

## 2023-06-14 DIAGNOSIS — D2261 Melanocytic nevi of right upper limb, including shoulder: Secondary | ICD-10-CM | POA: Diagnosis not present

## 2023-06-14 DIAGNOSIS — D225 Melanocytic nevi of trunk: Secondary | ICD-10-CM | POA: Diagnosis not present

## 2023-07-13 ENCOUNTER — Other Ambulatory Visit: Payer: BC Managed Care – PPO

## 2023-07-13 DIAGNOSIS — R7989 Other specified abnormal findings of blood chemistry: Secondary | ICD-10-CM

## 2023-07-13 LAB — TSH: TSH: 3.54 m[IU]/L

## 2023-07-13 LAB — T4, FREE: Free T4: 1.1 ng/dL (ref 0.8–1.8)

## 2023-07-14 ENCOUNTER — Encounter: Payer: Self-pay | Admitting: Family Medicine

## 2023-07-20 ENCOUNTER — Other Ambulatory Visit: Payer: Self-pay | Admitting: Family Medicine

## 2023-07-20 DIAGNOSIS — Z1231 Encounter for screening mammogram for malignant neoplasm of breast: Secondary | ICD-10-CM

## 2023-08-01 ENCOUNTER — Ambulatory Visit
Admission: RE | Admit: 2023-08-01 | Discharge: 2023-08-01 | Disposition: A | Discharge status: Home / Self Care | Source: Ambulatory Visit | Attending: Family Medicine | Admitting: Family Medicine

## 2023-08-01 DIAGNOSIS — Z1231 Encounter for screening mammogram for malignant neoplasm of breast: Secondary | ICD-10-CM | POA: Insufficient documentation

## 2023-08-02 ENCOUNTER — Other Ambulatory Visit: Payer: Self-pay | Admitting: Family Medicine

## 2023-08-02 DIAGNOSIS — R928 Other abnormal and inconclusive findings on diagnostic imaging of breast: Secondary | ICD-10-CM

## 2023-08-08 ENCOUNTER — Ambulatory Visit
Admission: RE | Admit: 2023-08-08 | Discharge: 2023-08-08 | Disposition: A | Discharge status: Home / Self Care | Source: Ambulatory Visit | Attending: Family Medicine | Admitting: Family Medicine

## 2023-08-08 DIAGNOSIS — R928 Other abnormal and inconclusive findings on diagnostic imaging of breast: Secondary | ICD-10-CM

## 2023-08-08 DIAGNOSIS — R92333 Mammographic heterogeneous density, bilateral breasts: Secondary | ICD-10-CM | POA: Diagnosis not present

## 2023-11-10 DIAGNOSIS — Z111 Encounter for screening for respiratory tuberculosis: Secondary | ICD-10-CM | POA: Diagnosis not present

## 2023-11-13 DIAGNOSIS — Z111 Encounter for screening for respiratory tuberculosis: Secondary | ICD-10-CM | POA: Diagnosis not present

## 2024-04-01 ENCOUNTER — Other Ambulatory Visit: Payer: BC Managed Care – PPO

## 2024-04-08 ENCOUNTER — Other Ambulatory Visit: Payer: Self-pay

## 2024-04-15 ENCOUNTER — Encounter: Payer: Self-pay | Admitting: Family Medicine

## 2024-05-14 ENCOUNTER — Other Ambulatory Visit: Payer: Self-pay | Admitting: Family Medicine

## 2024-05-14 DIAGNOSIS — Z0000 Encounter for general adult medical examination without abnormal findings: Secondary | ICD-10-CM

## 2024-05-14 DIAGNOSIS — Z131 Encounter for screening for diabetes mellitus: Secondary | ICD-10-CM

## 2024-05-14 DIAGNOSIS — R7989 Other specified abnormal findings of blood chemistry: Secondary | ICD-10-CM

## 2024-05-14 DIAGNOSIS — E7800 Pure hypercholesterolemia, unspecified: Secondary | ICD-10-CM

## 2024-05-17 ENCOUNTER — Other Ambulatory Visit

## 2024-05-18 LAB — CBC WITH DIFFERENTIAL/PLATELET
Absolute Lymphocytes: 1843 {cells}/uL (ref 850–3900)
Absolute Monocytes: 402 {cells}/uL (ref 200–950)
Basophils Absolute: 67 {cells}/uL (ref 0–200)
Basophils Relative: 1 %
Eosinophils Absolute: 121 {cells}/uL (ref 15–500)
Eosinophils Relative: 1.8 %
HCT: 41.6 % (ref 35.9–46.0)
Hemoglobin: 13.4 g/dL (ref 11.7–15.5)
MCH: 29.1 pg (ref 27.0–33.0)
MCHC: 32.2 g/dL (ref 31.6–35.4)
MCV: 90.4 fL (ref 81.4–101.7)
MPV: 9.5 fL (ref 7.5–12.5)
Monocytes Relative: 6 %
Neutro Abs: 4268 {cells}/uL (ref 1500–7800)
Neutrophils Relative %: 63.7 %
Platelets: 203 Thousand/uL (ref 140–400)
RBC: 4.6 Million/uL (ref 3.80–5.10)
RDW: 12.4 % (ref 11.0–15.0)
Total Lymphocyte: 27.5 %
WBC: 6.7 Thousand/uL (ref 3.8–10.8)

## 2024-05-18 LAB — TSH: TSH: 4.75 m[IU]/L — ABNORMAL HIGH

## 2024-05-18 LAB — HEMOGLOBIN A1C
Hgb A1c MFr Bld: 5.4 %
Mean Plasma Glucose: 108 mg/dL
eAG (mmol/L): 6 mmol/L

## 2024-05-18 LAB — LIPID PANEL
Cholesterol: 211 mg/dL — ABNORMAL HIGH
HDL: 99 mg/dL
LDL Cholesterol (Calc): 96 mg/dL
Non-HDL Cholesterol (Calc): 112 mg/dL
Total CHOL/HDL Ratio: 2.1 (calc)
Triglycerides: 69 mg/dL

## 2024-05-18 LAB — COMPREHENSIVE METABOLIC PANEL WITH GFR
AG Ratio: 2 (calc) (ref 1.0–2.5)
ALT: 40 U/L — ABNORMAL HIGH (ref 6–29)
AST: 23 U/L (ref 10–30)
Albumin: 4.7 g/dL (ref 3.6–5.1)
Alkaline phosphatase (APISO): 44 U/L (ref 31–125)
BUN: 10 mg/dL (ref 7–25)
CO2: 27 mmol/L (ref 20–32)
Calcium: 9.3 mg/dL (ref 8.6–10.2)
Chloride: 104 mmol/L (ref 98–110)
Creat: 0.79 mg/dL (ref 0.50–0.99)
Globulin: 2.3 g/dL (ref 1.9–3.7)
Glucose, Bld: 87 mg/dL (ref 65–99)
Potassium: 4.1 mmol/L (ref 3.5–5.3)
Sodium: 139 mmol/L (ref 135–146)
Total Bilirubin: 0.8 mg/dL (ref 0.2–1.2)
Total Protein: 7 g/dL (ref 6.1–8.1)
eGFR: 96 mL/min/1.73m2

## 2024-05-18 LAB — T4, FREE: Free T4: 1.2 ng/dL (ref 0.8–1.8)

## 2024-05-20 ENCOUNTER — Encounter: Payer: Self-pay | Admitting: Family Medicine

## 2024-05-20 ENCOUNTER — Ambulatory Visit (INDEPENDENT_AMBULATORY_CARE_PROVIDER_SITE_OTHER): Admitting: Family Medicine

## 2024-05-20 ENCOUNTER — Other Ambulatory Visit: Payer: Self-pay | Admitting: Family Medicine

## 2024-05-20 VITALS — BP 108/62 | HR 84 | Ht 67.0 in | Wt 159.2 lb

## 2024-05-20 DIAGNOSIS — Z975 Presence of (intrauterine) contraceptive device: Secondary | ICD-10-CM | POA: Diagnosis not present

## 2024-05-20 DIAGNOSIS — R7401 Elevation of levels of liver transaminase levels: Secondary | ICD-10-CM

## 2024-05-20 DIAGNOSIS — E7800 Pure hypercholesterolemia, unspecified: Secondary | ICD-10-CM

## 2024-05-20 DIAGNOSIS — Z1231 Encounter for screening mammogram for malignant neoplasm of breast: Secondary | ICD-10-CM

## 2024-05-20 DIAGNOSIS — Z0000 Encounter for general adult medical examination without abnormal findings: Secondary | ICD-10-CM | POA: Diagnosis not present

## 2024-05-20 DIAGNOSIS — Z23 Encounter for immunization: Secondary | ICD-10-CM

## 2024-05-20 DIAGNOSIS — R7989 Other specified abnormal findings of blood chemistry: Secondary | ICD-10-CM

## 2024-05-20 DIAGNOSIS — Z124 Encounter for screening for malignant neoplasm of cervix: Secondary | ICD-10-CM | POA: Diagnosis not present

## 2024-05-20 DIAGNOSIS — Z131 Encounter for screening for diabetes mellitus: Secondary | ICD-10-CM

## 2024-05-20 NOTE — Progress Notes (Signed)
 "  Subjective:    Patient ID: Damien DELENA Kitty, female    DOB: [birth year], 44 y.o.   MRN: [id number]  RASHEDA LEDGER is a 44 y.o. female presenting on 05/20/2024 for Annual Exam   HPI  Discussed the use of AI scribe software for clinical note transcription with the patient, who gave verbal consent to proceed.  History of Present Illness   LUCCA GREGGS is a 44 year old female who presents for routine health maintenance and vaccination updates.  - Uncertain about completion of hepatitis B vaccination series; considering hepatitis B titer or restarting series  Gynecologic health - No Pap smear in the past five to six years; planning to schedule one - Mirena  IUD in place for six years and requires replacement - Concerned about the possibility of needing two separate appointments for Pap smear and IUD replacement  Elevated HDL Normal range LDL but higher than previous 90 range now  Elevated ALT Hepatic laboratory abnormality - ALT liver enzyme increased to 40 on recent blood work, previously in the low teens AST inc but still normal. No other symptoms or concerns on this  BMI 24 / Lifestyle Some weight gain 11 lbs in 1 year Goals to improve lifestyle - Desires to improve diet, specifically by reducing carbohydrate intake   Elevated TSH to 4.75, prior 3.54, then previous 5.48 Free T4 normal range 1.2 She is asymptomatic but does admit chronic feeling tired. Never on treatment.  Unsure if any significant fam history   S/p Lumbar microdiskectomy 10/21/20   [facility]ed [facility] Fredric, now once yearly screening, multiple moles - Diagnosed with melanoma in situ - excised and treated.   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 - Off of medication      Health Maintenance:   Flu shot today   Last pap  smear 12/12/17  Negative pap and negative HPV co-testing - [facility] - Next due Pap Smear anytime - she will try to re-establish with [facility], also has Mirena  IUD in place interested in updating / replacement. Will refer again today   Mammogram 07/2024 with again 2 years with abnormal, requiring diagnostic. Initial 2024 required biopsy negative, 2025 did not require biopsy Biopsy, consistent with fibrocystic and fibroadenomatoid tissue. Order for 07/2024     05/20/2024    3:39 PM 04/10/2023    8:14 AM 03/30/2022    2:48 PM  Depression screen PHQ 2/9  Decreased Interest 0 0 0  Down, Depressed, Hopeless 0 0 0  PHQ - 2 Score 0 0 0  Altered sleeping 0  0  Tired, decreased energy 1  0  Change in appetite 0  0  Feeling bad or failure about yourself  0  0  Trouble concentrating 0  0  Moving slowly or fidgety/restless 0  0  Suicidal thoughts 0  0  PHQ-9 Score 1  0   Difficult doing work/chores Not difficult at all  Not difficult at all     Data saved with a previous flowsheet row definition       05/20/2024    3:39 PM 04/10/2023    8:14 AM 03/30/2022    2:49 PM 03/22/2021    1:37 PM  GAD 7 : Generalized Anxiety Score  Nervous, Anxious, on Edge 0 0 0 0  Control/stop worrying 0 0 0 0  Worry too much - different things 0 0 0 0  Trouble relaxing 0 0 0 0  Restless 0 0 0 0  Easily annoyed or irritable 0 0 0 0  Afraid - awful might happen 0 0 0 0  Total GAD 7 Score 0 0 0 0  Anxiety Difficulty  Not difficult at all Not difficult at all Not difficult at all     Past Medical History:  Diagnosis Date   Bacterial pneumonia    age 14   Postpartum hemorrhage    G1   Urinary retention    after second delivery   Past Surgical History:  Procedure Laterality Date   BREAST BIOPSY Left 07/27/2022   Stereo Bx X Clip- path pending   BREAST BIOPSY Left 07/27/2022   MM LT BREAST BX W LOC DEV 1ST LESION IMAGE BX SPEC STEREO GUIDE 07/27/2022 [facility]   LUMBAR LAMINECTOMY/  DECOMPRESSION WITH MET-RX Right 10/21/2020   Procedure: RIGHT Lumbar five - Sacral one MIS DISCECTOMY WITH MET-RX;  Surgeon: Cheryle Debby LABOR, MD;  Location: [facility] OR;  Service: Neurosurgery;  Laterality: Right;   MANDIBLE SURGERY  1999   TONSILLECTOMY AND ADENOIDECTOMY     middle school age   57 TOOTH EXTRACTION     middle school/high school - all four   Social History   Socioeconomic History   Marital status: Married    Spouse name: Not on file   Number of children: 2   Years of education: 19   Highest education level: Patent Examiner History   Occupation: PHYSICAL THERAPIST    Comment: [employer]  Tobacco Use   Smoking status: Never   Smokeless tobacco: Never  Vaping Use   Vaping status: Never Used  Substance and Sexual Activity   Alcohol use: Yes    Comment: OCC   Drug use: Never   Sexual activity: Yes    Birth control/protection: I.U.D.    Comment: Mirena   Other Topics Concern   Not on file  Social History Narrative   Not on file   Social Drivers of Health   Tobacco Use: Low Risk (05/20/2024)   Patient History    Smoking Tobacco Use: Never    Smokeless Tobacco Use: Never    Passive Exposure: Not on file  Financial Resource Strain: Patient Declined (04/10/2023)   Overall Financial Resource Strain (CARDIA)    Difficulty of Paying Living Expenses: Patient declined  Food Insecurity: Patient Declined (04/10/2023)   Hunger Vital Sign    Worried About Running Out of Food in the Last Year: Patient declined    Ran Out of Food in the Last Year: Patient declined  Transportation Needs: Patient Declined (04/10/2023)   PRAPARE - Administrator, Civil Service (Medical): Patient declined    Lack of Transportation (Non-Medical): Patient declined  Physical Activity: Patient Declined (04/10/2023)   Exercise Vital Sign    Days of Exercise per Week: Patient declined    Minutes of Exercise per Session: Patient declined  Stress: Patient Declined  (04/10/2023)   [employer] of Occupational Health - Occupational Stress Questionnaire    Feeling of Stress : Patient declined  Social Connections: Patient Declined (04/10/2023)   Social Connection and Isolation Panel    Frequency of Communication with Friends and Family: Patient declined    Frequency of Social Gatherings with Friends and Family: Patient declined    Attends Religious Services: Patient declined    Active Member of Clubs or Organizations: Patient declined  Attends Banker Meetings: Patient declined    Marital Status: Patient declined  Intimate Partner Violence: Patient Declined (04/10/2023)   Humiliation, Afraid, Rape, and Kick questionnaire    Fear of Current or Ex-Partner: Patient declined    Emotionally Abused: Patient declined    Physically Abused: Patient declined    Sexually Abused: Patient declined  Depression (PHQ2-9): Low Risk (05/20/2024)   Depression (PHQ2-9)    PHQ-2 Score: 1  Alcohol Screen: Not on file  Housing: Patient Declined (04/10/2023)   Housing    Last Housing Risk Score: 98  Utilities: Patient Declined (04/10/2023)   [employer] Utilities    Threatened with loss of utilities: Patient declined  Health Literacy: Patient Declined (04/10/2023)   B1300 Health Literacy    Frequency of need for help with medical instructions: Patient declines to respond   Family History  Problem Relation Age of Onset   Diabetes Mother        type 2 and GDM   Hypertension Mother    Deep vein thrombosis Mother    Hypertension Father    Mental illness Brother    Diabetes Maternal Grandmother    Heart disease Maternal Grandmother    Alzheimer's disease Paternal Grandfather    Healthy Daughter    Healthy Daughter    Breast cancer Neg Hx    Current Outpatient Medications on File Prior to Visit  Medication Sig   levonorgestrel  (MIRENA ) 20 MCG/24HR IUD 1 each by Intrauterine route once.   No current facility-administered medications on file prior to  visit.    Review of Systems  Constitutional:  Negative for activity change, appetite change, chills, diaphoresis, fatigue and fever.  HENT:  Negative for congestion and hearing loss.   Eyes:  Negative for visual disturbance.  Respiratory:  Negative for cough, chest tightness, shortness of breath and wheezing.   Cardiovascular:  Negative for chest pain, palpitations and leg swelling.  Gastrointestinal:  Negative for abdominal pain, constipation, diarrhea, nausea and vomiting.  Genitourinary:  Negative for dysuria, frequency and hematuria.  Musculoskeletal:  Negative for arthralgias and neck pain.  Skin:  Negative for rash.  Neurological:  Negative for dizziness, weakness, light-headedness, numbness and headaches.  Hematological:  Negative for adenopathy.  Psychiatric/Behavioral:  Negative for behavioral problems, dysphoric mood and sleep disturbance.    Per HPI unless specifically indicated above     Objective:    BP 108/62 (BP Location: Left Arm, Patient Position: Sitting, Cuff Size: Normal)   Pulse 84   Ht 5' 7 (1.702 m)   Wt 159 lb 4 oz (72.2 kg)   SpO2 95%   BMI 24.94 kg/m   Wt Readings from Last 3 Encounters:  05/20/24 159 lb 4 oz (72.2 kg)  04/10/23 148 lb (67.1 kg)  05/18/22 137 lb (62.1 kg)    Physical Exam Vitals and nursing note reviewed.  Constitutional:      General: She is not in acute distress.    Appearance: She is well-developed. She is not diaphoretic.     Comments: Well-appearing, comfortable, cooperative  HENT:     Head: Normocephalic and atraumatic.     Right Ear: Tympanic membrane, ear canal and external ear normal. There is no impacted cerumen.     Left Ear: Tympanic membrane, ear canal and external ear normal. There is no impacted cerumen.  Eyes:     General:        Right eye: No discharge.        Left eye: No discharge.  Conjunctiva/sclera: Conjunctivae normal.     Pupils: Pupils are equal, round, and reactive to light.  Neck:     Thyroid :  No thyromegaly.     Vascular: No carotid bruit.     Comments: No thyromegaly Cardiovascular:     Rate and Rhythm: Normal rate and regular rhythm.     Pulses: Normal pulses.     Heart sounds: Normal heart sounds. No murmur heard. Pulmonary:     Effort: Pulmonary effort is normal. No respiratory distress.     Breath sounds: Normal breath sounds. No wheezing or rales.  Abdominal:     General: Bowel sounds are normal. There is no distension.     Palpations: Abdomen is soft. There is no mass.     Tenderness: There is no abdominal tenderness.  Musculoskeletal:        General: No tenderness. Normal range of motion.     Cervical back: Normal range of motion and neck supple.     Right lower leg: No edema.     Left lower leg: No edema.     Comments: Upper / Lower Extremities: - Normal muscle tone, strength bilateral upper extremities 5/5, lower extremities 5/5  Lymphadenopathy:     Cervical: No cervical adenopathy.  Skin:    General: Skin is warm and dry.     Findings: No erythema or rash.  Neurological:     Mental Status: She is alert and oriented to person, place, and time.     Comments: Distal sensation intact to light touch all extremities  Psychiatric:        Mood and Affect: Mood normal.        Behavior: Behavior normal.        Thought Content: Thought content normal.     Comments: Well groomed, good eye contact, normal speech and thoughts     Results for orders placed or performed in visit on 05/14/24  TSH   Collection Time: 05/17/24  9:12 AM  Result Value Ref Range   TSH 4.75 (H) mIU/L  Lipid panel   Collection Time: 05/17/24  9:12 AM  Result Value Ref Range   Cholesterol 211 (H) <200 mg/dL   HDL 99 > OR = 50 mg/dL   Triglycerides 69 <849 mg/dL   LDL Cholesterol (Calc) 96 mg/dL (calc)   Total CHOL/HDL Ratio 2.1 <5.0 (calc)   Non-HDL Cholesterol (Calc) 112 <130 mg/dL (calc)  Hemoglobin J8r   Collection Time: 05/17/24  9:12 AM  Result Value Ref Range   Hgb A1c MFr  Bld 5.4 <5.7 %   Mean Plasma Glucose 108 mg/dL   eAG (mmol/L) 6.0 mmol/L  CBC with Differential/Platelet   Collection Time: 05/17/24  9:12 AM  Result Value Ref Range   WBC 6.7 3.8 - 10.8 Thousand/uL   RBC 4.60 3.80 - 5.10 Million/uL   Hemoglobin 13.4 11.7 - 15.5 g/dL   HCT 58.3 64.0 - 53.9 %   MCV 90.4 81.4 - 101.7 fL   MCH 29.1 27.0 - 33.0 pg   MCHC 32.2 31.6 - 35.4 g/dL   RDW 87.5 88.9 - 84.9 %   Platelets 203 140 - 400 Thousand/uL   MPV 9.5 7.5 - 12.5 fL   Neutro Abs 4,268 1,500 - 7,800 cells/uL   Absolute Lymphocytes 1,843 850 - 3,900 cells/uL   Absolute Monocytes 402 200 - 950 cells/uL   Eosinophils Absolute 121 15 - 500 cells/uL   Basophils Absolute 67 0 - 200 cells/uL   Neutrophils Relative % 63.7 %  Total Lymphocyte 27.5 %   Monocytes Relative 6.0 %   Eosinophils Relative 1.8 %   Basophils Relative 1.0 %  Comprehensive metabolic panel with GFR   Collection Time: 05/17/24  9:12 AM  Result Value Ref Range   Glucose, Bld 87 65 - 99 mg/dL   BUN 10 7 - 25 mg/dL   Creat 9.20 9.49 - 9.00 mg/dL   eGFR 96 > OR = 60 fO/fpw/8.26f7   BUN/Creatinine Ratio SEE NOTE: 6 - 22 (calc)   Sodium 139 135 - 146 mmol/L   Potassium 4.1 3.5 - 5.3 mmol/L   Chloride 104 98 - 110 mmol/L   CO2 27 20 - 32 mmol/L   Calcium 9.3 8.6 - 10.2 mg/dL   Total Protein 7.0 6.1 - 8.1 g/dL   Albumin 4.7 3.6 - 5.1 g/dL   Globulin 2.3 1.9 - 3.7 g/dL (calc)   AG Ratio 2.0 1.0 - 2.5 (calc)   Total Bilirubin 0.8 0.2 - 1.2 mg/dL   Alkaline phosphatase (APISO) 44 31 - 125 U/L   AST 23 10 - 30 U/L   ALT 40 (H) 6 - 29 U/L  T4, free   Collection Time: 05/17/24  9:12 AM  Result Value Ref Range   Free T4 1.2 0.8 - 1.8 ng/dL      Assessment & Plan:   Problem List Items Addressed This Visit     IUD (intrauterine device) in place   Relevant Orders   Ambulatory referral to Obstetrics / Gynecology   Other Visit Diagnoses       Annual physical exam    -  Primary     Flu vaccine need       Relevant Orders    Flu vaccine trivalent PF, 6mos and older(Flulaval,Afluria,Fluarix,Fluzone) (Completed)     Need for hepatitis B vaccination       Relevant Orders   Heplisav-B  (HepB-CPG) Vaccine (Completed)     Encounter for screening mammogram for malignant neoplasm of breast       Relevant Orders   MM 3D SCREENING MAMMOGRAM BILATERAL BREAST     Cervical cancer screening       Relevant Orders   Ambulatory referral to Obstetrics / Gynecology        Updated Health Maintenance information Reviewed recent lab results with patient Encouraged improvement to lifestyle with diet and exercise Goal of weight loss  Routine wellness visit focused on preventive care and screenings. 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 - Ordered routine mammogram for March 2026. Noted issue with abnormal mammo x 2 years, previously with biopsy then with diagnostic. We will track if still abnormal next time 07/2024 -Referral / Scheduled Pap smear and Mirena  IUD replacement with OB GYN.  Mildly elevated liver enzyme ALT Mild elevation in ALT only at 40, likely related to recent weight gain and LDL elevation. No immediate concern for liver pathology. - Monitor liver enzymes annually. - Encouraged lifestyle modifications including diet and exercise. - Repeat lab 1 year  Subclinical hypothyroidism Fluctuation of TSH in past 3 years. TSH slightly elevated but T4 within normal range. No immediate intervention required. - Monitor thyroid  function annually.  Bladder spasms Intermittent bladder spasms post-surgery, occurring rarely and not causing significant distress. - Monitor symptoms.         Orders Placed This Encounter  Procedures   MM 3D SCREENING MAMMOGRAM  BILATERAL BREAST    Standing Status:   Future    Expiration Date:   05/20/2025    Reason for Exam (SYMPTOM  OR DIAGNOSIS REQUIRED):    Screening bilateral 3D Mammogram Tomo    Preferred imaging location?:   [facility]   Flu vaccine trivalent PF, 6mos and older(Flulaval,Afluria,Fluarix,Fluzone)   Heplisav-B  (HepB-CPG) Vaccine   Ambulatory referral to Obstetrics / Gynecology    Referral Priority:   Routine    Referral Type:   Consultation    Referral Reason:   Specialty Services Required    Requested Specialty:   Obstetrics and Gynecology    Number of Visits Requested:   1    No orders of the defined types were placed in this encounter.    Follow up plan: Return for 1 year fasting lab > 1 week later Annual Physical.  Future 1 year, add TSH T4 to all labs  Marsa Officer, DO [facility] 05/20/2024, 3:51 PM  "

## 2024-05-20 NOTE — Patient Instructions (Addendum)
 Thank you for coming to the office today.  [facility] at [facility] [address] [place],  [place]  [address] Main: [phone] Fax: [phone]  Referral to GYN for pap smear and Mirena  IUD replacement  Recent Labs    05/17/24 0912  HGBA1C 5.4   Concern with mild elevation ALT liver enzyme 40, this is very mild and likely related to some minor weight gain and LDL elevation. Not a concern.  DUE for FASTING BLOOD WORK (no food or drink after midnight before the lab appointment, only water or coffee without cream/sugar on the morning of)  SCHEDULE Lab Only visit in the morning at the clinic for lab draw in 1 YEAR  - Make sure Lab Only appointment is at about 1 week before your next appointment, so that results will be available  For Lab Results, once available within 2-3 days of blood draw, you can can log in to MyChart online to view your results and a brief explanation. Also, we can discuss results at next follow-up visit.   Please schedule a Follow-up Appointment to: Return for 1 year fasting lab > 1 week later Annual Physical.  If you have any other questions or concerns, please feel free to call the office or send a message through MyChart. You may also schedule an earlier appointment if necessary.  Additionally, you may be receiving a survey about your experience at our office within a few days to 1 week by e-mail or mail. We value your feedback.  Marsa Officer, DO [facility], [place]

## 2024-06-24 ENCOUNTER — Ambulatory Visit

## 2025-05-12 ENCOUNTER — Encounter: Payer: BC Managed Care – PPO | Admitting: Family Medicine

## 2025-05-19 ENCOUNTER — Other Ambulatory Visit

## 2025-05-26 ENCOUNTER — Encounter: Admitting: Family Medicine
